# Patient Record
Sex: Male | Born: 1945 | Hispanic: Yes | Marital: Single | Smoking: Former smoker
Health system: Southern US, Community
[De-identification: ages and names within clinical notes are randomized; demographics above are authoritative.]

## PROBLEM LIST (undated history)

## (undated) DIAGNOSIS — M1990 Unspecified osteoarthritis, unspecified site: Secondary | ICD-10-CM

## (undated) DIAGNOSIS — I10 Essential (primary) hypertension: Secondary | ICD-10-CM

## (undated) DIAGNOSIS — I639 Cerebral infarction, unspecified: Secondary | ICD-10-CM

## (undated) DIAGNOSIS — E119 Type 2 diabetes mellitus without complications: Secondary | ICD-10-CM

---

## 2011-10-05 LAB — CBC
HCT: 44.2 % (ref 40.0–52.0)
HGB: 15 g/dL (ref 13.0–18.0)
MCH: 30.7 pg (ref 26.0–34.0)
MCHC: 33.9 g/dL (ref 32.0–36.0)
Platelet: 218 10*3/uL (ref 150–440)
WBC: 8.1 10*3/uL (ref 3.8–10.6)

## 2011-10-05 LAB — TROPONIN I: Troponin-I: <0.02 ng/mL

## 2011-10-05 LAB — BASIC METABOLIC PANEL
Calcium, Total: 8.9 mg/dL (ref 8.5–10.1)
Chloride: 102 mmol/L (ref 98–107)
Co2: 26 mmol/L (ref 21–32)
EGFR (Non-African Amer.): >60
Osmolality: 282 (ref 275–301)
Potassium: 4 mmol/L (ref 3.5–5.1)
Sodium: 138 mmol/L (ref 136–145)

## 2011-10-05 LAB — CK TOTAL AND CKMB (NOT AT ARMC): CK-MB: 2.9 ng/mL (ref 0.5–3.6)

## 2011-10-06 ENCOUNTER — Inpatient Hospital Stay: Payer: Self-pay | Admitting: Specialist

## 2011-10-06 LAB — CK TOTAL AND CKMB (NOT AT ARMC)
CK, Total: 93 U/L (ref 35–232)
CK, Total: 72 U/L (ref 35–232)
CK-MB: 1.7 ng/mL (ref 0.5–3.6)
CK-MB: 1.4 ng/mL (ref 0.5–3.6)

## 2011-10-06 LAB — TROPONIN I: Troponin-I: <0.02 ng/mL

## 2011-10-06 LAB — TSH: Thyroid Stimulating Horm: 0.534 u[IU]/mL

## 2011-10-06 LAB — LIPID PANEL
HDL Cholesterol: 40 mg/dL (ref 40–60)
Ldl Cholesterol, Calc: 78 mg/dL (ref 0–100)
Triglycerides: 159 mg/dL (ref 0–200)
VLDL Cholesterol, Calc: 32 mg/dL (ref 5–40)

## 2011-10-06 LAB — HEMOGLOBIN A1C: Hemoglobin A1C: 6.9 % — ABNORMAL HIGH (ref 4.2–6.3)

## 2011-10-07 LAB — BASIC METABOLIC PANEL
BUN: 24 mg/dL — ABNORMAL HIGH (ref 7–18)
Chloride: 105 mmol/L (ref 98–107)
Co2: 26 mmol/L (ref 21–32)
EGFR (African American): >60
EGFR (Non-African Amer.): >60
Glucose: 139 mg/dL — ABNORMAL HIGH (ref 65–99)
Potassium: 4.2 mmol/L (ref 3.5–5.1)
Sodium: 137 mmol/L (ref 136–145)

## 2011-10-07 LAB — MAGNESIUM: Magnesium: 2.3 mg/dL

## 2011-10-07 LAB — PROTIME-INR: INR: 0.9

## 2011-11-29 ENCOUNTER — Encounter: Payer: Self-pay | Admitting: Internal Medicine

## 2011-12-11 ENCOUNTER — Encounter: Payer: Self-pay | Admitting: Internal Medicine

## 2012-07-15 ENCOUNTER — Emergency Department: Payer: Self-pay | Admitting: Emergency Medicine

## 2012-07-15 LAB — BASIC METABOLIC PANEL
Anion Gap: 6 — ABNORMAL LOW (ref 7–16)
BUN: 24 mg/dL — ABNORMAL HIGH (ref 7–18)
Chloride: 105 mmol/L (ref 98–107)
Co2: 27 mmol/L (ref 21–32)
Creatinine: 1.09 mg/dL (ref 0.60–1.30)
EGFR (African American): >60
EGFR (Non-African Amer.): >60
Glucose: 104 mg/dL — ABNORMAL HIGH (ref 65–99)
Potassium: 4.2 mmol/L (ref 3.5–5.1)
Sodium: 138 mmol/L (ref 136–145)

## 2012-07-15 LAB — CBC
HGB: 13 g/dL (ref 13.0–18.0)
MCHC: 33.9 g/dL (ref 32.0–36.0)
MCV: 85 fL (ref 80–100)
Platelet: 247 10*3/uL (ref 150–440)

## 2012-12-14 ENCOUNTER — Emergency Department: Payer: Self-pay | Admitting: Emergency Medicine

## 2013-02-04 ENCOUNTER — Ambulatory Visit: Payer: Self-pay | Admitting: Gastroenterology

## 2013-07-08 ENCOUNTER — Encounter: Payer: Self-pay | Admitting: Internal Medicine

## 2013-07-10 ENCOUNTER — Encounter: Payer: Self-pay | Admitting: Internal Medicine

## 2013-08-09 ENCOUNTER — Encounter: Payer: Self-pay | Admitting: Internal Medicine

## 2014-01-19 ENCOUNTER — Emergency Department: Payer: Self-pay | Admitting: Emergency Medicine

## 2014-08-03 NOTE — Discharge Summary (Signed)
PATIENT NAME:  Isaiah Rogers, Isaiah Rogers MR#:  [id number] DATE OF BIRTH:  [birth year]  DATE OF ADMISSION:  10/06/2011 DATE OF DISCHARGE:  10/07/2011  For a detailed note, please take a look at the history and physical done on admission by Dr Margie EgePhichith.    DIAGNOSES AT DISCHARGE:  1. Acute ischemic stroke.  2. Left arm and left foot weakness secondary to a stroke.  3. Hypertension.   DIET: The patient is being discharged on a low-sodium, low-fat diet.   ACTIVITY: As tolerated.   FOLLOWUP: With the [facility] in the next 2 to 4 weeks.   DISCHARGE MEDICATIONS:   1. Aspirin 81 mg daily.  2. Lisinopril 5 mg daily.  3. Pravachol 10 mg daily.   Patient is being discharged home with home health physical therapy, occupational therapy, RN and aide services.   CONSULTANTS DURING THE HOSPITAL COURSE: Dr. Cristopher PeruHemang Shah from Neurology.   PERTINENT STUDIES DONE DURING THE HOSPITAL COURSE: CT scan of the head done without contrast on October 05, 2011, showing findings suggestive of a subacute region lacunar infarction within the right periventricular region, small vessel white matter ischemic changes. No acute hemorrhage. MRI of the brain done showing bilateral acute ischemic foci. Severe chronic subcortical deep white matter chronic ischemia. Ultrasound of the carotids showing no evidence of hemodynamically significant carotid stenosis with antegrade flow in both vertebrals. A 2-dimensional echocardiogram showing normal chamber size with normal LV function, mild left ventricular hypertrophy, with mild diastolic dysfunction.   HOSPITAL COURSE: This is a 69 year old male who presented to the hospital with a 3-day history of left hand and left arm and left leg weakness.   PROBLEMS: 1. Acute/subacute cerebrovascular accident: The patient presented to the hospital with some left-sided weakness. The CT scan of the head without contrast initially showed a subacute cerebrovascular accident in the left side near the  periventricular region. The patient was started on aspirin, also allowed for some permissive hypertension given his acute stroke. The patient's MRI did show bilateral foci of stroke although his symptoms are only on the left side. Neurology consult was obtained. Patient was seen by Dr. Sherryll BurgerShah.  He mentioned continuing the baby aspirin along with low-dose ACE inhibitors and a statin and outpatient aggressive PT, OT. The patient, therefore, is being discharged on the aspirin and a statin along with home health PT and occupational therapy.  2. Hypertension. The patient likely had undiagnosed hypertension.  We allowed for some permissive hypertension given his acute stroke. He is being discharged on low-dose ACE inhibitor for now.  3. Hyperlipidemia. The patient was started on low dose Pravachol and is currently being discharged on that.   CODE STATUS: THE PATIENT IS A FULL CODE.   TIME SPENT WITH DISCHARGE: 40 minutes.      ____________________________ Rolly PancakeVivek J. Cherlynn KaiserSainani, MD vjs:vtd D: 10/07/2011 15:40:55 ET T: 10/08/2011 10:03:55 ET JOB#: [id number]  cc: Rolly PancakeVivek J. Cherlynn KaiserSainani, MD, <Dictator> [facility] [place] SirenVIVEK J Kamilya Wakeman MD ELECTRONICALLY SIGNED 10/19/2011 12:24

## 2014-08-03 NOTE — H&P (Signed)
PATIENT NAME:  Isaiah Rogers, Isaiah Rogers MR#:  [id number] DATE OF BIRTH:  [birth year]  DATE OF ADMISSION:  10/06/2011  REFERRING PHYSICIAN: Dr. Marilynne Halsted    PRIMARY CARE PHYSICIAN: None   PRESENTING COMPLAINT: Left-sided weakness and numbness.   HISTORY OF PRESENT ILLNESS: Isaiah Rogers is a 69 year old Hispanic gentleman who speaks Spanish only presenting with reports of developing left-sided numbness and weakness three days ago. The patient reports symptoms progressively worsened and decided to come in today for evaluation. He denies any chest pain, shortness of breath, palpitations, presyncope or syncope. No similar episodes in the past. He reports difficulty with ambulation due to the weakness and numbness. He has poor grip. The patient is not followed by a primary physician.   PAST MEDICAL HISTORY: None.   PAST SURGICAL HISTORY: None.   ALLERGIES: None.   MEDICATIONS: He recently had a reaction from a plant exposure and was seen in [facility] and was given medication but does not know what to help with the rash and itching.   FAMILY HISTORY: No stroke, heart disease, or diabetes.   SOCIAL HISTORY: He lives in [place]. He is originally [place] He lives under rent from his employer. No tobacco, quit greater than 25 years ago. He drinks 2 to 3 shots of liquor every other week. No drug use.   REVIEW OF SYSTEMS: CONSTITUTIONAL: No fevers, nausea, or vomiting. EYES: No visual changes. ENT: No epistaxis, discharge. RESPIRATORY: No cough, hemoptysis. CARDIOVASCULAR: No chest pain, edema, palpitations, or syncope. GI: No nausea, vomiting, diarrhea, abdominal pain, hematemesis, or melena. GU: No dysuria or hematuria. ENDOCRINE: No polyuria or polydipsia. HEME: No bleeding. SKIN: No ulcers. He has resolution of his rash as per history of present illness. MUSCULOSKELETAL: No joint pain or swelling. NEUROLOGIC: As per history of present illness. PSYCH: Denies any suicidal ideation. He does feel  depressed from usual.   PHYSICAL EXAMINATION:   VITAL SIGNS: Temperature 97.4, pulse 84, respiratory rate 22, blood pressure 160/98, sating at 94% on room air.   GENERAL: Lying in bed in no apparent distress.   HEENT: Normocephalic, atraumatic. Pupils are equal and symmetric, nonicteric. Nares without discharge. Moist mucous membrane.   NECK: Soft and supple. No adenopathy. No JVP. No carotid bruits.   CARDIOVASCULAR: Non-tachy. No murmurs, rubs, or gallops.   LUNGS: Clear to auscultation bilaterally. No use of accessory muscles or increased respiratory effort.   ABDOMEN: Soft. Positive bowel sounds. No mass appreciated.   EXTREMITIES: No edema. Dorsal pedis pulses intact.   MUSCULOSKELETAL: No joint effusion.   SKIN: No ulcers.   NEUROLOGIC: No dysarthria. He has decreased strength of the upper extremities 4 out of 5 on the left side. In his bilateral lower extremities there is minimal differential in strength but reports difficulty with weightbearing and ambulation. No tongue deviation. Nose-to-finger intact. No asterixis or pronator drift.   PERTINENT LABS AND STUDIES: CT of the head without contrast shows findings likely reflecting the sequela of subacute region lacunar infarction within the right paraventricular region. Mild small vessel white matter ischemic changes. No evidence of acute hemorrhage.  WBC 8.1, hemoglobin 15, hematocrit 44.2, platelets 218, MCV 91, glucose 160, BUN 21, creatinine 1.13, sodium 138, potassium 4, chloride 102, carbon dioxide 26, calcium 8.9. Troponin less than 0.02. CK 134. MB 2.9.   EKG with sinus rate of 80. No ST elevation or depression. There is T wave inversion in aVR that is nonspecific.   ASSESSMENT AND PLAN: Isaiah Rogers is a 69 year old gentleman with  no prior diagnosis of medical history, not followed by PCP presenting with left-sided weakness and numbness for the past three days.  1. Acute/subacute CVA. CT scan results as dictated above.  Continue on tele. Cycle cardiac enzymes. Start on aspirin. Send TSH, fasting lipid panel, A1c for risk stratification. Will send for an MRI, carotid Doppler's, and echocardiogram. Obtain PT, OT, Care Management consultation. He may require rehab prior to returning home. Will need PCP upon discharge.  2. Hypertension, uncontrolled. May have longstanding untreated hypertension or secondary to CVA. Allow for permissive hypertension for now and if remains elevated consider medication prior to discharge.  3. Hyperglycemia. As above, send A1c.  4. Prophylaxis with aspirin and Lovenox.   TIME SPENT: Approximately 50 minutes spent on patient care. Interpreter was present during the entire interview.   ____________________________ Isaiah DerbyAlounthith Bret Stamour, MD ap:drc D: 10/06/2011 00:34:13 ET T: 10/06/2011 06:48:52 ET JOB#: [id number]  cc: Isaiah DerbyAlounthith Elih Mooney, MD, <Dictator> Isaiah DerbyALOUNTHITH [place] Liz MD ELECTRONICALLY SIGNED 10/29/2011 0:11

## 2014-08-21 ENCOUNTER — Other Ambulatory Visit: Payer: Self-pay | Admitting: Internal Medicine

## 2014-08-21 DIAGNOSIS — M545 Low back pain: Principal | ICD-10-CM

## 2014-08-21 DIAGNOSIS — G8929 Other chronic pain: Secondary | ICD-10-CM

## 2014-09-01 ENCOUNTER — Ambulatory Visit
Admission: RE | Admit: 2014-09-01 | Discharge: 2014-09-01 | Disposition: A | Discharge status: Home / Self Care | Payer: Medicare Other | Source: Ambulatory Visit | Attending: Internal Medicine | Admitting: Internal Medicine

## 2014-09-01 DIAGNOSIS — M6281 Muscle weakness (generalized): Secondary | ICD-10-CM | POA: Diagnosis present

## 2014-09-01 DIAGNOSIS — G544 Lumbosacral root disorders, not elsewhere classified: Secondary | ICD-10-CM | POA: Diagnosis not present

## 2014-09-01 DIAGNOSIS — M545 Low back pain, unspecified: Secondary | ICD-10-CM

## 2014-09-01 DIAGNOSIS — G8929 Other chronic pain: Secondary | ICD-10-CM

## 2014-09-01 DIAGNOSIS — M5386 Other specified dorsopathies, lumbar region: Secondary | ICD-10-CM | POA: Insufficient documentation

## 2014-09-01 MED ORDER — GADOBENATE DIMEGLUMINE 529 MG/ML IV SOLN
12.0000 mL | Freq: Once | INTRAVENOUS | Status: AC | PRN
  Administered 2014-09-01: 12 mL via INTRAVENOUS

## 2014-11-28 ENCOUNTER — Other Ambulatory Visit: Payer: Self-pay | Admitting: Neurosurgery

## 2014-12-19 ENCOUNTER — Encounter (HOSPITAL_COMMUNITY): Payer: Self-pay

## 2014-12-19 ENCOUNTER — Encounter (HOSPITAL_COMMUNITY)
Admission: RE | Admit: 2014-12-19 | Discharge: 2014-12-19 | Disposition: A | Discharge status: Home / Self Care | Payer: Medicare Other | Source: Ambulatory Visit | Attending: Neurosurgery | Admitting: Neurosurgery

## 2014-12-19 DIAGNOSIS — I10 Essential (primary) hypertension: Secondary | ICD-10-CM | POA: Diagnosis not present

## 2014-12-19 DIAGNOSIS — Z01818 Encounter for other preprocedural examination: Secondary | ICD-10-CM | POA: Insufficient documentation

## 2014-12-19 DIAGNOSIS — E119 Type 2 diabetes mellitus without complications: Secondary | ICD-10-CM | POA: Insufficient documentation

## 2014-12-19 DIAGNOSIS — Z7982 Long term (current) use of aspirin: Secondary | ICD-10-CM | POA: Insufficient documentation

## 2014-12-19 DIAGNOSIS — M4316 Spondylolisthesis, lumbar region: Secondary | ICD-10-CM | POA: Insufficient documentation

## 2014-12-19 DIAGNOSIS — Z79899 Other long term (current) drug therapy: Secondary | ICD-10-CM | POA: Diagnosis not present

## 2014-12-19 DIAGNOSIS — R001 Bradycardia, unspecified: Secondary | ICD-10-CM | POA: Insufficient documentation

## 2014-12-19 DIAGNOSIS — Z8673 Personal history of transient ischemic attack (TIA), and cerebral infarction without residual deficits: Secondary | ICD-10-CM | POA: Insufficient documentation

## 2014-12-19 DIAGNOSIS — Z0183 Encounter for blood typing: Secondary | ICD-10-CM | POA: Diagnosis not present

## 2014-12-19 DIAGNOSIS — Z01812 Encounter for preprocedural laboratory examination: Secondary | ICD-10-CM | POA: Diagnosis not present

## 2014-12-19 HISTORY — DX: Type 2 diabetes mellitus without complications: E11.9

## 2014-12-19 HISTORY — DX: Cerebral infarction, unspecified: I63.9

## 2014-12-19 HISTORY — DX: Essential (primary) hypertension: I10

## 2014-12-19 HISTORY — DX: Unspecified osteoarthritis, unspecified site: M19.90

## 2014-12-19 LAB — COMPREHENSIVE METABOLIC PANEL
ALT: 18 U/L (ref 17–63)
ANION GAP: 7 (ref 5–15)
AST: 21 U/L (ref 15–41)
Albumin: 3.9 g/dL (ref 3.5–5.0)
Alkaline Phosphatase: 82 U/L (ref 38–126)
BILIRUBIN TOTAL: 0.6 mg/dL (ref 0.3–1.2)
BUN: 17 mg/dL (ref 6–20)
CHLORIDE: 107 mmol/L (ref 101–111)
CO2: 25 mmol/L (ref 22–32)
Calcium: 9 mg/dL (ref 8.9–10.3)
Creatinine, Ser: 0.94 mg/dL (ref 0.61–1.24)
GFR calc Af Amer: >60 mL/min (ref >60)
Glucose, Bld: 111 mg/dL — ABNORMAL HIGH (ref 65–99)
Potassium: 4.1 mmol/L (ref 3.5–5.1)
Sodium: 139 mmol/L (ref 135–145)
TOTAL PROTEIN: 7.8 g/dL (ref 6.5–8.1)

## 2014-12-19 LAB — CBC
HCT: 41.7 % (ref 39.0–52.0)
Hemoglobin: 14.1 g/dL (ref 13.0–17.0)
MCH: 29.2 pg (ref 26.0–34.0)
MCHC: 33.8 g/dL (ref 30.0–36.0)
MCV: 86.3 fL (ref 78.0–100.0)
PLATELETS: 255 10*3/uL (ref 150–400)
RBC: 4.83 MIL/uL (ref 4.22–5.81)
RDW: 13.2 % (ref 11.5–15.5)
WBC: 6.9 10*3/uL (ref 4.0–10.5)

## 2014-12-19 LAB — ABO/RH: ABO/RH(D): O POS

## 2014-12-19 LAB — SURGICAL PCR SCREEN
MRSA, PCR: NEGATIVE
STAPHYLOCOCCUS AUREUS: NEGATIVE

## 2014-12-19 LAB — TYPE AND SCREEN
ABO/RH(D): O POS
ANTIBODY SCREEN: NEGATIVE

## 2014-12-19 NOTE — Progress Notes (Signed)
Call to pharm. Tech. For med. rec.

## 2014-12-19 NOTE — Pre-Procedure Instructions (Addendum)
Isaiah Rogers  12/19/2014      [facility] [place], [place] - [address] [place] [address] Phone: [phone] Fax: [phone]    Your procedure is scheduled on 12/26/2014   Report to [facility] Admitting at 7:30 A.M.   Call this number if you have problems the morning of surgery:   [phone]   Remember:  Do not eat food or drink liquids after midnight.  On Thursday                         NO ASIPRIN AFTER Saturday September 2016, the 10 th   Take these medicines the morning of surgery with A SIP OF WATER :     NONE   Do not wear jewelry   Do not wear lotions, powders, or perfumes.  You may wear deodorant.              Men may shave face and neck.   Do not bring valuables to the hospital.   [facility] is not responsible for any belongings or valuables.  Contacts, dentures or bridgework may not be worn into surgery.  Leave your suitcase in the car.  After surgery it may be brought to your room.  For patients admitted to the hospital, discharge time will be determined by your treatment team.  Patients discharged the day of surgery will not be allowed to drive home.   Name and phone number of your driver:   /w family  Special instructions:  Special Instructions: [place] - Preparing for Surgery  Before surgery, you can play an important role.  Because skin is not sterile, your skin needs to be as free of germs as possible.  You can reduce the number of germs on you skin by washing with CHG (chlorahexidine gluconate) soap before surgery.  CHG is an antiseptic cleaner which kills germs and bonds with the skin to continue killing germs even after washing.  Please DO NOT use if you have an allergy to CHG or antibacterial soaps.  If your skin becomes reddened/irritated stop using the CHG and inform your nurse when you arrive at Short Stay.  Do not shave (including legs and underarms) for at least 48 hours prior to  the first CHG shower.  You may shave your face.  Please follow these instructions carefully:   1.  Shower with CHG Soap the night before surgery and the  morning of Surgery.  2.  If you choose to wash your hair, wash your hair first as usual with your  normal shampoo.  3.  After you shampoo, rinse your hair and body thoroughly to remove the  Shampoo.  4.  Use CHG as you would any other liquid soap.  You can apply chg directly to the skin and wash gently with scrungie or a clean washcloth.  5.  Apply the CHG Soap to your body ONLY FROM THE NECK DOWN.    Do not use on open wounds or open sores.  Avoid contact with your eyes, ears, mouth and genitals (private parts).  Wash genitals (private parts)   with your normal soap.  6.  Wash thoroughly, paying special attention to the area where your surgery will be performed.  7.  Thoroughly rinse your body with warm water from the neck down.  8.  DO NOT shower/wash with your normal soap after using and rinsing off   the CHG  Soap.  9.  Pat yourself dry with a clean towel.            10.  Wear clean pajamas.            11.  Place clean sheets on your bed the night of your first shower and do not sleep with pets.  Day of Surgery  Do not apply any lotions/deodorants the morning of surgery.  Please wear clean clothes to the hospital/surgery center.  Please read over the following fact sheets that you were given. Pain Booklet, Coughing and Deep Breathing, Blood Transfusion Information, MRSA Information and Surgical Site Infection Prevention

## 2014-12-19 NOTE — Progress Notes (Signed)
Call to [facility] because in pt.'s broken spanish (per interpretor) he thinks that's where his doctor is. Left on hold for 15 mins. At [place], not able to track pt.  Call to Darl Pikes at Dr. Val Riles office, name of PCP provided & will follow up with a call to PCP at [facility],.  Via Fax, requested records from PCP.

## 2014-12-20 LAB — HEMOGLOBIN A1C
Hgb A1c MFr Bld: 6.3 % — ABNORMAL HIGH (ref 4.8–5.6)
Mean Plasma Glucose: 134 mg/dL

## 2014-12-22 NOTE — Progress Notes (Signed)
Anesthesia Chart Review:  Pt is 69 year old male scheduled for L4-5 PLIF on 12/26/2014 with Dr. Conchita Paris.   Pt is spanish-speaking only and requires interpreter.   PMH includes: stroke (2013), HTN, DM (not on meds). Unknown smoking history. BMI 24.   Medications include: ASA, iron, lisinopril, pravastatin.   Preoperative labs reviewed.  HgbA1c 6.3, glucose 111.   EKG 12/19/2014: sinus bradycardia (54 bpm).   If no changes, I anticipate pt can proceed with surgery as scheduled.   Rica Mast, FNP-BC [facility]/Anesthesiology Phone: [phone] 12/22/2014 3:46 PM

## 2014-12-26 ENCOUNTER — Inpatient Hospital Stay (HOSPITAL_COMMUNITY): Payer: Medicare Other

## 2014-12-26 ENCOUNTER — Inpatient Hospital Stay (HOSPITAL_COMMUNITY)
Admission: RE | Admit: 2014-12-26 | Discharge: 2014-12-29 | DRG: 460 | Disposition: A | Discharge status: Home Health | Payer: Medicare Other | Source: Ambulatory Visit | Attending: Neurosurgery | Admitting: Neurosurgery

## 2014-12-26 ENCOUNTER — Encounter (HOSPITAL_COMMUNITY): Payer: Self-pay | Admitting: *Deleted

## 2014-12-26 ENCOUNTER — Inpatient Hospital Stay (HOSPITAL_COMMUNITY): Payer: Medicare Other | Admitting: Certified Registered Nurse Anesthetist

## 2014-12-26 ENCOUNTER — Encounter (HOSPITAL_COMMUNITY)
Admission: RE | Disposition: A | Discharge status: Home Health | Payer: Self-pay | Source: Ambulatory Visit | Attending: Neurosurgery

## 2014-12-26 ENCOUNTER — Inpatient Hospital Stay (HOSPITAL_COMMUNITY): Payer: Medicare Other | Admitting: Emergency Medicine

## 2014-12-26 DIAGNOSIS — Z7982 Long term (current) use of aspirin: Secondary | ICD-10-CM

## 2014-12-26 DIAGNOSIS — M4316 Spondylolisthesis, lumbar region: Secondary | ICD-10-CM | POA: Diagnosis present

## 2014-12-26 DIAGNOSIS — E119 Type 2 diabetes mellitus without complications: Secondary | ICD-10-CM | POA: Diagnosis present

## 2014-12-26 DIAGNOSIS — I10 Essential (primary) hypertension: Secondary | ICD-10-CM | POA: Diagnosis present

## 2014-12-26 DIAGNOSIS — M4310 Spondylolisthesis, site unspecified: Secondary | ICD-10-CM

## 2014-12-26 DIAGNOSIS — Z79899 Other long term (current) drug therapy: Secondary | ICD-10-CM | POA: Diagnosis not present

## 2014-12-26 DIAGNOSIS — Z87891 Personal history of nicotine dependence: Secondary | ICD-10-CM | POA: Diagnosis not present

## 2014-12-26 DIAGNOSIS — M4806 Spinal stenosis, lumbar region: Secondary | ICD-10-CM | POA: Diagnosis present

## 2014-12-26 DIAGNOSIS — M4326 Fusion of spine, lumbar region: Secondary | ICD-10-CM

## 2014-12-26 DIAGNOSIS — M4726 Other spondylosis with radiculopathy, lumbar region: Secondary | ICD-10-CM | POA: Diagnosis present

## 2014-12-26 LAB — CREATININE, SERUM
CREATININE: 0.98 mg/dL (ref 0.61–1.24)
GFR calc Af Amer: >60 mL/min (ref >60)
GFR calc non Af Amer: >60 mL/min (ref >60)

## 2014-12-26 LAB — CBC
HCT: 35.5 % — ABNORMAL LOW (ref 39.0–52.0)
Hemoglobin: 11.7 g/dL — ABNORMAL LOW (ref 13.0–17.0)
MCH: 28.3 pg (ref 26.0–34.0)
MCHC: 33 g/dL (ref 30.0–36.0)
MCV: 85.7 fL (ref 78.0–100.0)
PLATELETS: 206 10*3/uL (ref 150–400)
RBC: 4.14 MIL/uL — AB (ref 4.22–5.81)
RDW: 12.9 % (ref 11.5–15.5)
WBC: 11.5 10*3/uL — ABNORMAL HIGH (ref 4.0–10.5)

## 2014-12-26 LAB — GLUCOSE, CAPILLARY
Glucose-Capillary: 107 mg/dL — ABNORMAL HIGH (ref 65–99)
Glucose-Capillary: 127 mg/dL — ABNORMAL HIGH (ref 65–99)

## 2014-12-26 SURGERY — POSTERIOR LUMBAR FUSION 1 LEVEL
Anesthesia: General | Site: Back

## 2014-12-26 MED ORDER — PRAVASTATIN SODIUM 20 MG PO TABS
20.0000 mg | ORAL_TABLET | Freq: Every day | ORAL | Status: DC
  Administered 2014-12-26 – 2014-12-28 (×3): 20 mg via ORAL
  Filled 2014-12-26 (×3): qty 1

## 2014-12-26 MED ORDER — MENTHOL 3 MG MT LOZG: 1.0000 | LOZENGE | OROMUCOSAL | Status: DC | PRN

## 2014-12-26 MED ORDER — PHENOL 1.4 % MT LIQD: 1.0000 | OROMUCOSAL | Status: DC | PRN

## 2014-12-26 MED ORDER — HYDROMORPHONE HCL 1 MG/ML IJ SOLN
INTRAMUSCULAR | Status: AC
  Filled 2014-12-26: qty 1

## 2014-12-26 MED ORDER — SALONPAS EX PADS: 1.0000 | MEDICATED_PAD | Freq: Every day | CUTANEOUS | Status: DC | PRN

## 2014-12-26 MED ORDER — VECURONIUM BROMIDE 10 MG IV SOLR
INTRAVENOUS | Status: DC | PRN
  Administered 2014-12-26: 2 mg via INTRAVENOUS
  Administered 2014-12-26: 3 mg via INTRAVENOUS

## 2014-12-26 MED ORDER — SENNA 8.6 MG PO TABS
1.0000 | ORAL_TABLET | Freq: Two times a day (BID) | ORAL | Status: DC
  Administered 2014-12-26 – 2014-12-29 (×6): 8.6 mg via ORAL
  Filled 2014-12-26 (×6): qty 1

## 2014-12-26 MED ORDER — 0.9 % SODIUM CHLORIDE (POUR BTL) OPTIME
TOPICAL | Status: DC | PRN
  Administered 2014-12-26: 1000 mL

## 2014-12-26 MED ORDER — FENTANYL CITRATE (PF) 100 MCG/2ML IJ SOLN
INTRAMUSCULAR | Status: DC | PRN
  Administered 2014-12-26: 100 ug via INTRAVENOUS
  Administered 2014-12-26 (×3): 50 ug via INTRAVENOUS

## 2014-12-26 MED ORDER — EPHEDRINE SULFATE 50 MG/ML IJ SOLN
INTRAMUSCULAR | Status: DC | PRN
  Administered 2014-12-26 (×3): 5 mg via INTRAVENOUS
  Administered 2014-12-26 (×2): 10 mg via INTRAVENOUS

## 2014-12-26 MED ORDER — MORPHINE SULFATE (PF) 2 MG/ML IV SOLN
1.0000 mg | INTRAVENOUS | Status: DC | PRN
Start: 2014-12-26 — End: 2014-12-29

## 2014-12-26 MED ORDER — LIDOCAINE HCL (CARDIAC) 20 MG/ML IV SOLN
INTRAVENOUS | Status: AC
  Filled 2014-12-26: qty 5

## 2014-12-26 MED ORDER — OXYCODONE-ACETAMINOPHEN 5-325 MG PO TABS
1.0000 | ORAL_TABLET | ORAL | Status: DC | PRN
Start: 2014-12-26 — End: 2014-12-29
  Administered 2014-12-26 – 2014-12-27 (×2): 2 via ORAL
  Administered 2014-12-27 – 2014-12-29 (×5): 1 via ORAL
  Filled 2014-12-26 (×2): qty 1
  Filled 2014-12-26: qty 2
  Filled 2014-12-26 (×2): qty 1
  Filled 2014-12-26: qty 2
  Filled 2014-12-26: qty 1

## 2014-12-26 MED ORDER — LIDOCAINE-EPINEPHRINE 1 %-1:100000 IJ SOLN
INTRAMUSCULAR | Status: DC | PRN
  Administered 2014-12-26: 10 mL

## 2014-12-26 MED ORDER — PROMETHAZINE HCL 25 MG/ML IJ SOLN: 6.2500 mg | INTRAMUSCULAR | Status: DC | PRN

## 2014-12-26 MED ORDER — ROCURONIUM BROMIDE 100 MG/10ML IV SOLN
INTRAVENOUS | Status: DC | PRN
  Administered 2014-12-26: 50 mg via INTRAVENOUS

## 2014-12-26 MED ORDER — EPHEDRINE SULFATE 50 MG/ML IJ SOLN
INTRAMUSCULAR | Status: AC
  Filled 2014-12-26: qty 1

## 2014-12-26 MED ORDER — BISACODYL 10 MG RE SUPP: 10.0000 mg | Freq: Every day | RECTAL | Status: DC | PRN

## 2014-12-26 MED ORDER — ACETAMINOPHEN 650 MG RE SUPP: 650.0000 mg | RECTAL | Status: DC | PRN

## 2014-12-26 MED ORDER — SODIUM CHLORIDE 0.9 % IJ SOLN
3.0000 mL | Freq: Two times a day (BID) | INTRAMUSCULAR | Status: DC
  Administered 2014-12-26 – 2014-12-28 (×4): 3 mL via INTRAVENOUS

## 2014-12-26 MED ORDER — LISINOPRIL 20 MG PO TABS
20.0000 mg | ORAL_TABLET | Freq: Every day | ORAL | Status: DC
  Administered 2014-12-27 – 2014-12-29 (×2): 20 mg via ORAL
  Filled 2014-12-26 (×3): qty 1

## 2014-12-26 MED ORDER — PROPOFOL 10 MG/ML IV BOLUS
INTRAVENOUS | Status: AC
  Filled 2014-12-26: qty 20

## 2014-12-26 MED ORDER — FENTANYL CITRATE (PF) 250 MCG/5ML IJ SOLN
INTRAMUSCULAR | Status: AC
  Filled 2014-12-26: qty 5

## 2014-12-26 MED ORDER — SODIUM CHLORIDE 0.9 % IR SOLN
Status: DC | PRN
  Administered 2014-12-26: 500 mL

## 2014-12-26 MED ORDER — HEPARIN SODIUM (PORCINE) 5000 UNIT/ML IJ SOLN
5000.0000 [IU] | Freq: Three times a day (TID) | INTRAMUSCULAR | Status: DC
Start: 2014-12-27 — End: 2014-12-29
  Administered 2014-12-27 – 2014-12-29 (×6): 5000 [IU] via SUBCUTANEOUS
  Filled 2014-12-26 (×6): qty 1

## 2014-12-26 MED ORDER — FERROUS SULFATE 325 (65 FE) MG PO TABS
325.0000 mg | ORAL_TABLET | Freq: Every day | ORAL | Status: DC
  Administered 2014-12-27 – 2014-12-29 (×3): 325 mg via ORAL
  Filled 2014-12-26 (×3): qty 1

## 2014-12-26 MED ORDER — SODIUM CHLORIDE 0.9 % IV SOLN
INTRAVENOUS | Status: DC
  Administered 2014-12-26: 75 mL/h via INTRAVENOUS
  Administered 2014-12-27 (×2): via INTRAVENOUS

## 2014-12-26 MED ORDER — MIDAZOLAM HCL 5 MG/5ML IJ SOLN
INTRAMUSCULAR | Status: DC | PRN
  Administered 2014-12-26: 2 mg via INTRAVENOUS

## 2014-12-26 MED ORDER — NEOSTIGMINE METHYLSULFATE 10 MG/10ML IV SOLN
INTRAVENOUS | Status: DC | PRN
Start: 2014-12-26 — End: 2014-12-26
  Administered 2014-12-26: 3 mg via INTRAVENOUS

## 2014-12-26 MED ORDER — SODIUM CHLORIDE 0.9 % IV SOLN: 250.0000 mL | INTRAVENOUS | Status: DC

## 2014-12-26 MED ORDER — HYDROMORPHONE HCL 1 MG/ML IJ SOLN
0.2500 mg | INTRAMUSCULAR | Status: DC | PRN
  Administered 2014-12-26 (×2): 0.5 mg via INTRAVENOUS

## 2014-12-26 MED ORDER — ARTIFICIAL TEARS OP OINT
TOPICAL_OINTMENT | OPHTHALMIC | Status: DC | PRN
  Administered 2014-12-26: 1 via OPHTHALMIC

## 2014-12-26 MED ORDER — LIDOCAINE HCL (CARDIAC) 20 MG/ML IV SOLN
INTRAVENOUS | Status: DC | PRN
  Administered 2014-12-26: 100 mg via INTRAVENOUS

## 2014-12-26 MED ORDER — ACETAMINOPHEN 325 MG PO TABS
650.0000 mg | ORAL_TABLET | ORAL | Status: DC | PRN
  Administered 2014-12-27 – 2014-12-29 (×4): 650 mg via ORAL
  Filled 2014-12-26 (×4): qty 2

## 2014-12-26 MED ORDER — VECURONIUM BROMIDE 10 MG IV SOLR
INTRAVENOUS | Status: AC
  Filled 2014-12-26: qty 10

## 2014-12-26 MED ORDER — ONDANSETRON HCL 4 MG/2ML IJ SOLN
INTRAMUSCULAR | Status: AC
  Filled 2014-12-26: qty 2

## 2014-12-26 MED ORDER — SODIUM CHLORIDE 0.9 % IJ SOLN: 3.0000 mL | INTRAMUSCULAR | Status: DC | PRN

## 2014-12-26 MED ORDER — ONDANSETRON HCL 4 MG/2ML IJ SOLN
INTRAMUSCULAR | Status: DC | PRN
  Administered 2014-12-26: 4 mg via INTRAVENOUS

## 2014-12-26 MED ORDER — DIAZEPAM 5 MG PO TABS: 5.0000 mg | ORAL_TABLET | Freq: Four times a day (QID) | ORAL | Status: DC | PRN

## 2014-12-26 MED ORDER — GLYCOPYRROLATE 0.2 MG/ML IJ SOLN
INTRAMUSCULAR | Status: DC | PRN
  Administered 2014-12-26: .5 mg via INTRAVENOUS

## 2014-12-26 MED ORDER — CEFAZOLIN SODIUM-DEXTROSE 2-3 GM-% IV SOLR
2.0000 g | Freq: Three times a day (TID) | INTRAVENOUS | Status: AC
  Administered 2014-12-27 (×2): 2 g via INTRAVENOUS
  Filled 2014-12-26 (×2): qty 50

## 2014-12-26 MED ORDER — STERILE WATER FOR INJECTION IJ SOLN
INTRAMUSCULAR | Status: AC
  Filled 2014-12-26: qty 10

## 2014-12-26 MED ORDER — ARTIFICIAL TEARS OP OINT
TOPICAL_OINTMENT | OPHTHALMIC | Status: AC
  Filled 2014-12-26: qty 3.5

## 2014-12-26 MED ORDER — SUCCINYLCHOLINE CHLORIDE 20 MG/ML IJ SOLN
INTRAMUSCULAR | Status: AC
  Filled 2014-12-26: qty 1

## 2014-12-26 MED ORDER — DOCUSATE SODIUM 100 MG PO CAPS
100.0000 mg | ORAL_CAPSULE | Freq: Two times a day (BID) | ORAL | Status: DC
  Administered 2014-12-26 – 2014-12-29 (×6): 100 mg via ORAL
  Filled 2014-12-26 (×6): qty 1

## 2014-12-26 MED ORDER — PROPOFOL 10 MG/ML IV BOLUS
INTRAVENOUS | Status: DC | PRN
  Administered 2014-12-26: 150 mg via INTRAVENOUS

## 2014-12-26 MED ORDER — THROMBIN 20000 UNITS EX SOLR
CUTANEOUS | Status: DC | PRN
  Administered 2014-12-26: 20 mL via TOPICAL

## 2014-12-26 MED ORDER — LACTATED RINGERS IV SOLN
INTRAVENOUS | Status: DC
  Administered 2014-12-26 (×2): via INTRAVENOUS

## 2014-12-26 MED ORDER — THROMBIN 5000 UNITS EX SOLR
OROMUCOSAL | Status: DC | PRN
  Administered 2014-12-26: 10 mL via TOPICAL

## 2014-12-26 MED ORDER — MIDAZOLAM HCL 2 MG/2ML IJ SOLN
INTRAMUSCULAR | Status: AC
  Filled 2014-12-26: qty 4

## 2014-12-26 MED ORDER — ROCURONIUM BROMIDE 50 MG/5ML IV SOLN
INTRAVENOUS | Status: AC
  Filled 2014-12-26: qty 1

## 2014-12-26 MED ORDER — CYANOCOBALAMIN 250 MCG PO TABS
250.0000 ug | ORAL_TABLET | Freq: Every day | ORAL | Status: DC
  Administered 2014-12-27 – 2014-12-29 (×3): 250 ug via ORAL
  Filled 2014-12-26 (×7): qty 1

## 2014-12-26 MED ORDER — GABAPENTIN 300 MG PO CAPS
300.0000 mg | ORAL_CAPSULE | Freq: Three times a day (TID) | ORAL | Status: DC
  Administered 2014-12-26 – 2014-12-29 (×8): 300 mg via ORAL
  Filled 2014-12-26 (×8): qty 1

## 2014-12-26 MED ORDER — ONDANSETRON HCL 4 MG/2ML IJ SOLN: 4.0000 mg | INTRAMUSCULAR | Status: DC | PRN

## 2014-12-26 MED ORDER — PHENYLEPHRINE 40 MCG/ML (10ML) SYRINGE FOR IV PUSH (FOR BLOOD PRESSURE SUPPORT)
PREFILLED_SYRINGE | INTRAVENOUS | Status: AC
  Filled 2014-12-26: qty 10

## 2014-12-26 MED ORDER — ROPINIROLE HCL 1 MG PO TABS
0.5000 mg | ORAL_TABLET | Freq: Every day | ORAL | Status: DC
  Administered 2014-12-26 – 2014-12-28 (×3): 0.5 mg via ORAL
  Filled 2014-12-26 (×3): qty 1

## 2014-12-26 MED ORDER — PANTOPRAZOLE SODIUM 40 MG IV SOLR
40.0000 mg | Freq: Every day | INTRAVENOUS | Status: DC
  Administered 2014-12-26: 40 mg via INTRAVENOUS
  Filled 2014-12-26: qty 40

## 2014-12-26 MED ORDER — CEFAZOLIN SODIUM-DEXTROSE 2-3 GM-% IV SOLR
2.0000 g | INTRAVENOUS | Status: AC
  Administered 2014-12-26: 2 g via INTRAVENOUS
  Filled 2014-12-26: qty 50

## 2014-12-26 SURGICAL SUPPLY — 73 items
BAG DECANTER FOR FLEXI CONT (MISCELLANEOUS) ×3 IMPLANT
BENZOIN TINCTURE PRP APPL 2/3 (GAUZE/BANDAGES/DRESSINGS) IMPLANT
BIT DRILL 3.5 POWEREASE (BIT) ×2 IMPLANT
BIT DRILL 3.5MM POWEREASE (BIT) ×1
BLADE CLIPPER SURG (BLADE) IMPLANT
BLADE SURG 11 STRL SS (BLADE) ×3 IMPLANT
BUR MATCHSTICK NEURO 3.0 LAGG (BURR) ×3 IMPLANT
BUR PRECISION FLUTE 5.0 (BURR) ×3 IMPLANT
CANISTER SUCT 3000ML PPV (MISCELLANEOUS) ×3 IMPLANT
CATH FOLEY 2WAY SLVR  5CC 14FR (CATHETERS) ×2
CATH FOLEY 2WAY SLVR 5CC 14FR (CATHETERS) ×1 IMPLANT
CLOSURE WOUND 1/2 X4 (GAUZE/BANDAGES/DRESSINGS)
CONT SPEC 4OZ CLIKSEAL STRL BL (MISCELLANEOUS) ×3 IMPLANT
COVER BACK TABLE 60X90IN (DRAPES) ×3 IMPLANT
DECANTER SPIKE VIAL GLASS SM (MISCELLANEOUS) ×3 IMPLANT
DERMABOND ADHESIVE PROPEN (GAUZE/BANDAGES/DRESSINGS) ×2
DERMABOND ADVANCED .7 DNX6 (GAUZE/BANDAGES/DRESSINGS) ×1 IMPLANT
DRAPE C-ARM 42X72 X-RAY (DRAPES) ×6 IMPLANT
DRAPE C-ARMOR (DRAPES) ×3 IMPLANT
DRAPE LAPAROTOMY 100X72X124 (DRAPES) ×3 IMPLANT
DRAPE MICROSCOPE LEICA (MISCELLANEOUS) ×3 IMPLANT
DRAPE POUCH INSTRU U-SHP 10X18 (DRAPES) ×3 IMPLANT
DRAPE SURG 17X23 STRL (DRAPES) ×3 IMPLANT
DRSG OPSITE POSTOP 4X6 (GAUZE/BANDAGES/DRESSINGS) ×3 IMPLANT
DURAPREP 26ML APPLICATOR (WOUND CARE) ×3 IMPLANT
ELECT REM PT RETURN 9FT ADLT (ELECTROSURGICAL) ×3
ELECTRODE REM PT RTRN 9FT ADLT (ELECTROSURGICAL) ×1 IMPLANT
GAUZE SPONGE 4X4 12PLY STRL (GAUZE/BANDAGES/DRESSINGS) IMPLANT
GAUZE SPONGE 4X4 16PLY XRAY LF (GAUZE/BANDAGES/DRESSINGS) IMPLANT
GLOVE BIOGEL PI IND STRL 7.5 (GLOVE) ×1 IMPLANT
GLOVE BIOGEL PI INDICATOR 7.5 (GLOVE) ×2
GLOVE ECLIPSE 7.0 STRL STRAW (GLOVE) ×3 IMPLANT
GLOVE EXAM NITRILE LRG STRL (GLOVE) IMPLANT
GLOVE EXAM NITRILE MD LF STRL (GLOVE) IMPLANT
GLOVE EXAM NITRILE XL STR (GLOVE) IMPLANT
GLOVE EXAM NITRILE XS STR PU (GLOVE) IMPLANT
GOWN STRL REUS W/ TWL LRG LVL3 (GOWN DISPOSABLE) ×2 IMPLANT
GOWN STRL REUS W/ TWL XL LVL3 (GOWN DISPOSABLE) IMPLANT
GOWN STRL REUS W/TWL 2XL LVL3 (GOWN DISPOSABLE) IMPLANT
GOWN STRL REUS W/TWL LRG LVL3 (GOWN DISPOSABLE) ×4
GOWN STRL REUS W/TWL XL LVL3 (GOWN DISPOSABLE)
HEMOSTAT POWDER KIT SURGIFOAM (HEMOSTASIS) ×3 IMPLANT
KIT BASIN OR (CUSTOM PROCEDURE TRAY) ×3 IMPLANT
KIT INFUSE SMALL (Orthopedic Implant) ×3 IMPLANT
KIT ROOM TURNOVER OR (KITS) ×3 IMPLANT
MILL MEDIUM DISP (BLADE) ×3 IMPLANT
NEEDLE HYPO 18GX1.5 BLUNT FILL (NEEDLE) IMPLANT
NEEDLE HYPO 25X1 1.5 SAFETY (NEEDLE) ×3 IMPLANT
NEEDLE SPNL 18GX3.5 QUINCKE PK (NEEDLE) IMPLANT
NS IRRIG 1000ML POUR BTL (IV SOLUTION) ×3 IMPLANT
PACK LAMINECTOMY NEURO (CUSTOM PROCEDURE TRAY) ×3 IMPLANT
PAD ARMBOARD 7.5X6 YLW CONV (MISCELLANEOUS) ×9 IMPLANT
POWEREASE STERILE 3.5MM DRILL BIT ×2 IMPLANT
ROD CC 30MM (Rod) ×6 IMPLANT
RUBBERBAND STERILE (MISCELLANEOUS) ×6 IMPLANT
SCREW 5.5X30MM (Screw) ×6 IMPLANT
SCREW 5.5X35MM (Screw) ×4 IMPLANT
SCREW BN 35X5.5XMA NS SPNE (Screw) ×2 IMPLANT
SCREW SET SOLERA (Screw) ×8 IMPLANT
SCREW SET SOLERA TI (Screw) ×4 IMPLANT
SPONGE LAP 4X18 X RAY DECT (DISPOSABLE) IMPLANT
SPONGE SURGIFOAM ABS GEL 100 (HEMOSTASIS) ×3 IMPLANT
STRIP BIOACTIVE VITOSS 25X52X4 (Orthopedic Implant) ×3 IMPLANT
STRIP CLOSURE SKIN 1/2X4 (GAUZE/BANDAGES/DRESSINGS) IMPLANT
SUT VIC AB 0 CT1 18XCR BRD8 (SUTURE) ×1 IMPLANT
SUT VIC AB 0 CT1 8-18 (SUTURE) ×2
SUT VIC AB 2-0 CT1 18 (SUTURE) IMPLANT
SUT VICRYL 3-0 RB1 18 ABS (SUTURE) ×3 IMPLANT
SYR 3ML LL SCALE MARK (SYRINGE) IMPLANT
TOWEL OR 17X24 6PK STRL BLUE (TOWEL DISPOSABLE) ×3 IMPLANT
TOWEL OR 17X26 10 PK STRL BLUE (TOWEL DISPOSABLE) ×3 IMPLANT
TRAP SPECIMEN MUCOUS 40CC (MISCELLANEOUS) ×3 IMPLANT
WATER STERILE IRR 1000ML POUR (IV SOLUTION) ×3 IMPLANT

## 2014-12-26 NOTE — H&P (Signed)
CC:  No chief complaint on file.   HPI: Isaiah Rogers is a 69 year old man seen for initial consultation in the office. He is a native [place] speaker. His primary complaint is fairly chronic back pain, although over the last few months it has become constant. He says previously, he had had intermittent flareups of back and leg pain, but now the pain is fairly constant. He says it is worsened when he first gets up after being sedentary for a period of time. It is worst in the morning when he gets up. He says after he has moving around for a while the pain actually improves a slight bit. He also has pain which runs down the back of his leg, on the left worse than the right. He is also describing numbness especially of his feet, left worse than the right. He has undergone a course of physical therapy for this, but says that it did not improve his pain, and actually may have made his back will bit worse.   PMH: Past Medical History  Diagnosis Date  . Hypertension   . Stroke   . Arthritis     lumbar spondylosis  . Diabetes mellitus without complication     told that he has diabetes but not treated     PSH: History reviewed. No pertinent past surgical history.  SH: Social History  Substance Use Topics  . Smoking status: Former Smoker    Types: Cigarettes  . Smokeless tobacco: Former Neurosurgeon    Quit date: 12/19/1990     Comment: quit more than 25 years ago  . Alcohol Use: No    MEDS: Prior to Admission medications   Medication Sig Start Date End Date Taking? Authorizing Provider  aspirin EC 325 MG tablet Take 325 mg by mouth daily with lunch.   Yes Historical Provider, MD  Cyanocobalamin (VITAMIN B12 PO) Take 1 tablet by mouth at bedtime.   Yes Historical Provider, MD  gabapentin (NEURONTIN) 300 MG capsule Take 300 mg by mouth 3 (three) times daily.    Yes Historical Provider, MD  Liniments Chinita Pester) PADS Apply 1 each topically daily as needed (back pain).   Yes Historical Provider,  MD  lisinopril (PRINIVIL,ZESTRIL) 20 MG tablet Take 20 mg by mouth daily.   Yes Historical Provider, MD  rOPINIRole (REQUIP) 0.5 MG tablet Take 0.5 mg by mouth at bedtime.   Yes Historical Provider, MD  ferrous sulfate 325 (65 FE) MG tablet Take 325 mg by mouth daily with breakfast.    Historical Provider, MD  pravastatin (PRAVACHOL) 20 MG tablet Take 20 mg by mouth at bedtime.     Historical Provider, MD    ALLERGY: No Known Allergies  ROS: ROS  NEUROLOGIC EXAM: Awake, alert, oriented Memory and concentration grossly intact Speech fluent, appropriate CN grossly intact Motor exam: Upper Extremities Deltoid Bicep Tricep Grip  Right 5/5 5/5 5/5 5/5  Left 5/5 5/5 5/5 5/5   Lower Extremity IP Quad PF DF EHL  Right 5/5 5/5 5/5 5/5 5/5  Left 5/5 5/5 5/5 5/5 5/5   Sensation grossly intact to LT  IMGAING: MRI of the lumbar spine was reviewed, which demonstrates primary pathology at L4 L5 where there is bilateral L4 pars defects, and grade 2 spondylolisthesis. Thecal sac is patent, however there is severe bilateral foraminal stenosis due to the spondylolisthesis.  IMPRESSION: 69 year old man with back and leg pain related to grade 2 spondylolisthesis at L4 L5.  PLAN: - Proceed with L4-5 fusion  I  reviewed the MRI findings with the patient and his family in the office. Treatment options were discussed, including continued conservative treatment, versus surgical decompression and fusion. I explained to the patient the details of the procedure, as well as the risks which include but are not limited to nerve root injury leading to leg or foot weakness and or bowel and bladder dysfunction, CSF leak, bleeding, and infection. Possible outcomes of surgery were also discussed including the possibility of uncomplicated surgery but persistence of pain symptoms and the possiblity of accelerated adjacent level degeneration. All questions were answered.

## 2014-12-26 NOTE — Op Note (Signed)
PREOP DIAGNOSIS:  1. 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 Spondylolisthesis, L4-5  POSTOP DIAGNOSIS: Same  PROCEDURE: 1. L4-5 Gill procedure (L4 lamiectomy, radical bilateral L4-5 facetectomy) for decompression of L4 nreve roots 2. Bilateral L5 foraminotomies 3. Posterior non-segmental instrumentation using cortical pedicle screws at L4, L5, Medtronic Solera 5.5 x 30, 5.5 x 35 4. Posterolateral arthrodesis, L4-5 5. Use of locally harvested morcellized bone autograft 6. Use of non-structural bone allograft (Vitoss) 6. Use of BMP  SURGEON: Dr. Lisbeth Renshaw, MD  ASSISTANT: Dr. Barnett Abu, MD  ANESTHESIA: General Endotracheal  EBL: 200cc  SPECIMENS: None  DRAINS: None  COMPLICATIONS: none immediate  CONDITION: Hemodynamically stable to PACU  HISTORY: Isaiah Rogers is a 69 y.o. male initially seen in the outpatient clinic with back and leg pain c/w mechanical type pain with a radiculopathy. MRI demonstrated grade 2 spondylolisthesis with L4 pars defect and severe bilateral foraminal stenosis. He failed an attempt at conservative treatment and elected to proceed with surgical decompression and fusion.  PROCEDURE IN DETAIL: After informed consent was obtained and witnessed, the patient was brought to the operating room. After induction of general anesthesia, the patient was positioned on the operative table in the prone position. All pressure points were meticulously padded. Previous skin incision was then marked out and prepped and draped in the usual sterile fashion.  After timeout was conducted, skin was infiltrated with local anesthetic. Skin incision was then made sharply and Bovie electrocautery was used to dissect the subcutaneous tissue until the lumbodorsal fascia was identified and incised. The muscle was then elevated in the subperiosteal plane. Self-retaining retractors were then placed after exposure of the L4 lamina out to the pars  interarticularis as was the L4-5 facet complex. Correct level was confirmed with fluoroscopy. The bilateral pars defects were easily identified.  Using intraoperative fluoroscopy, entry points for bilateral L4 cortical pedicle screws were identified. Pilot holes were then created under lateral fluoroscopy and tapped to 30mm x 5.5 mm.  At this point attention was turned to decompression. Complete L4 laminectomy with facetectomy was completed using a combination of Kerrison rongeurs and a high-speed drill. The pars defect was identified bilaterally. The L4-5 foramina were noted to be significantly stenotic due to the anterolisthesis. Good decompression was confirmed after facetectomy with a blunt probe. Proximal L5-S1 foraminotomy was also completed with kerrison rongeurs.  At this point, the space between the L4 and L5 nerve roots on both sides was very limited, and it did not seem feasible to retract the thecal sac and traversing L5 root without placing excessive traction on the L5 roots. I therefore elected not to attempt discectomy and interbody fusion.  Therefore proceeded with identification of bilateral L5 cortical pedicle screw entry points under lateral fluoroscopy. Pilot holes were then created with the high-speed drill, and tapped to 35 mm x 5.5 mm.  Attention was then turned to dissection in the posterolateral gutters, exposing the L4 and L5 lateral facet and transverse processes bilaterally. The high-speed drill was then used to decorticate these posterior lateral bone surfaces.  The posterior lateral gutter was then packed with a small pack of BMP, and graft was placed utilizing a combination of morcellized bone autograft harvested during the decompression portion of the procedure, with Vitoss.  Within completed placement of the cortical pedicle screws with 5.5 x 30 mm screws at L4, and 5.5 x 35 mm screws at L5. A short pre-bent lordotic rod was then placed, and setscrews placed and final  tightened. Final AP and lateral fluoroscopic images were taken which confirmed good position of the hardware. Good hemostasis was then secured using a combination of bipolar electrocautery and morcellized Gelfoam with thrombin. The wound is then irrigated with copious amounts of antibiotic irrigation.  The wound is then closed in standard fashion using a combination of interrupted 0 and 3-0 Vicryl stitches  cottonoid,he muscular, fascial, and subcutaneous layers. Skin was then closed using standard Dermabond. Sterile dressing was then applied. The patient was then transferred to the stretcher, extubated, and taken to the postanesthesia care unit in stable hemodynamic condition.  At the end of the case all sponge, needle, and instrument counts were correct.

## 2014-12-26 NOTE — Anesthesia Preprocedure Evaluation (Addendum)
Anesthesia Evaluation  Patient identified by MRN, date of birth, ID band Patient awake    Reviewed: Allergy & Precautions, NPO status , Patient's Chart, lab work & pertinent test results  Airway Mallampati: II  TM Distance: >3 FB Neck ROM: Full    Dental   Pulmonary neg pulmonary ROS, former smoker,    breath sounds clear to auscultation       Cardiovascular hypertension,  Rhythm:Regular Rate:Normal     Neuro/Psych    GI/Hepatic negative GI ROS, Neg liver ROS,   Endo/Other  diabetes  Renal/GU      Musculoskeletal   Abdominal   Peds  Hematology   Anesthesia Other Findings   Reproductive/Obstetrics                            Anesthesia Physical Anesthesia Plan  ASA: III  Anesthesia Plan: General   Post-op Pain Management:    Induction: Intravenous  Airway Management Planned: Oral ETT  Additional Equipment:   Intra-op Plan:   Post-operative Plan: Extubation in OR  Informed Consent: I have reviewed the patients History and Physical, chart, labs and discussed the procedure including the risks, benefits and alternatives for the proposed anesthesia with the patient or authorized representative who has indicated his/her understanding and acceptance.   Dental advisory given  Plan Discussed with: CRNA and Anesthesiologist  Anesthesia Plan Comments:        Anesthesia Quick Evaluation

## 2014-12-26 NOTE — Anesthesia Procedure Notes (Signed)
Procedure Name: Intubation Date/Time: 12/26/2014 10:18 AM Performed by: Margaree Mackintosh Pre-anesthesia Checklist: Patient identified, Emergency Drugs available, Suction available, Patient being monitored and Timeout performed Patient Re-evaluated:Patient Re-evaluated prior to inductionOxygen Delivery Method: Circle system utilized Preoxygenation: Pre-oxygenation with 100% oxygen Intubation Type: IV induction Ventilation: Mask ventilation without difficulty Laryngoscope Size: Mac and 3 Grade View: Grade I Tube type: Oral Tube size: 7.5 mm Number of attempts: 1 Airway Equipment and Method: Stylet Placement Confirmation: ETT inserted through vocal cords under direct vision,  positive ETCO2 and breath sounds checked- equal and bilateral Secured at: 22 cm Tube secured with: Tape Dental Injury: Teeth and Oropharynx as per pre-operative assessment  Comments: Very loose bottom teeth. Moveable with scissoring of the mouth. Remain as per pre-op assessment.

## 2014-12-26 NOTE — Anesthesia Postprocedure Evaluation (Signed)
  Anesthesia Post-op Note  Patient: Isaiah Rogers  Procedure(s) Performed: Procedure(s) with comments: POSTERIOR LUMBAR FUSION 1 LEVEL (N/A) - L45 posterior lumbar interbody fusion with interbody prosthesis posterior lateral arthrodesis and posterior nonsegmental instrumentation  Patient Location: PACU  Anesthesia Type:General  Level of Consciousness: awake  Airway and Oxygen Therapy: Patient Spontanous Breathing  Post-op Pain: mild  Post-op Assessment: Post-op Vital signs reviewed              Post-op Vital Signs: Reviewed  Last Vitals:  Filed Vitals:   12/26/14 1449  BP:   Pulse:   Temp: 37.2 C  Resp:     Complications: No apparent anesthesia complications

## 2014-12-26 NOTE — Progress Notes (Signed)
Utilization review completed.  

## 2014-12-26 NOTE — Progress Notes (Signed)
Used interpreter line, spoke with [place], [phone], until Karrie Meres arrived at this time. Neuro OR called, spoke with Ivar Drape and informed.

## 2014-12-26 NOTE — Transfer of Care (Signed)
Immediate Anesthesia Transfer of Care Note  Patient: Isaiah Rogers  Procedure(s) Performed: Procedure(s) with comments: POSTERIOR LUMBAR FUSION 1 LEVEL (N/A) - L45 posterior lumbar interbody fusion with interbody prosthesis posterior lateral arthrodesis and posterior nonsegmental instrumentation  Patient Location: PACU  Anesthesia Type:General  Level of Consciousness: awake and alert   Airway & Oxygen Therapy: Patient Spontanous Breathing and Patient connected to nasal cannula oxygen  Post-op Assessment: Report given to RN, Post -op Vital signs reviewed and stable and Patient moving all extremities X 4  Post vital signs: Reviewed and stable  Last Vitals:  Filed Vitals:   12/26/14 0716  BP: 142/86  Pulse: 58  Temp: 36.8 C  Resp: 18    Complications: No apparent anesthesia complications

## 2014-12-27 MED ORDER — PANTOPRAZOLE SODIUM 40 MG PO TBEC
40.0000 mg | DELAYED_RELEASE_TABLET | Freq: Every day | ORAL | Status: DC
  Administered 2014-12-27 – 2014-12-28 (×2): 40 mg via ORAL
  Filled 2014-12-27 (×2): qty 1

## 2014-12-27 NOTE — Progress Notes (Signed)
Foley Catheter removed at 1216, patient due to void by 1800. Patient stated via interpreter that he voided while working with PT. Bladder scan used showing 50mL residual. Patient states he does not need to void. Will continue to monitor.

## 2014-12-27 NOTE — Plan of Care (Signed)
Mr Kegley does not have a back brace with him and did not get fitted for one prior to surgery.  Will postpone ambulation until a brace can be obtained.

## 2014-12-27 NOTE — Evaluation (Signed)
Occupational Therapy Evaluation Patient Details Name: Isaiah Rogers MRN: [id number] DOB: 10-May-1945 Today's Date: 12/27/2014    History of Present Illness Pt is a 69 y/o male s/p L4 laminectomy, radical bilateral L4-L5 facetectomy, bilateral L5 foraminotomies, and L4-L5 fusion.  PMH of HTN, stroke, arthritis, and DM.   Clinical Impression   Pt admitted with above. He demonstrates the below listed deficits and will benefit from continued OT to maximize safety and independence with BADLs.  Pt is able to perform ADLs with supervision.  He will benefit from another OT session to ensure carry over of precautions and safety.       Follow Up Recommendations  Home health OT;Supervision/Assistance - 24 hour    Equipment Recommendations  None recommended by OT    Recommendations for Other Services       Precautions / Restrictions Precautions Precautions: Back;Fall Precaution Booklet Issued: No Precaution Comments: precautions verbalized to pt via telephone translator but unsure of pt understanding Required Braces or Orthoses: Spinal Brace Restrictions Weight Bearing Restrictions: No      Mobility Bed Mobility Overal bed mobility: Needs Assistance Bed Mobility: Rolling;Sidelying to Sit Rolling: Min assist (visual cues and assist to bend knees and log roll; bed rails) Sidelying to sit: Modified independent (Device/Increase time) (bed rails)          Transfers Overall transfer level: Needs assistance Equipment used: None Transfers: Sit to/from [employer] Sit to Stand: Supervision Stand pivot transfers: Supervision            Balance Overall balance assessment: Needs assistance Sitting-balance support: Feet supported Sitting balance-Leahy Scale: Good     Standing balance support: During functional activity Standing balance-Leahy Scale: Fair                              ADL Overall ADL's : Needs assistance/impaired Eating/Feeding:  Independent;Sitting   Grooming: Wash/dry hands;Wash/dry face;Oral care;Brushing hair;Supervision/safety;Standing Grooming Details (indicate cue type and reason): Pt instructed to bring cup to mouth to spit in while brushing teeth, and to avoid bending while shaving  Upper Body Bathing: Set up;Supervision/ safety;Sitting   Lower Body Bathing: Supervison/ safety;Sit to/from stand Lower Body Bathing Details (indicate cue type and reason): Pt is able to cross ankles over knees to simulate bathing feet  Upper Body Dressing : Set up;Supervision/safety;Sitting   Lower Body Dressing: Supervision/safety;Set up;Sit to/from stand Lower Body Dressing Details (indicate cue type and reason): Pt was instructed to cross ankles over knees for LB ADLs and was able to return demonstration  Toilet Transfer: Supervision/safety;Ambulation;Regular Toilet;BSC;RW   Toileting- Clothing Manipulation and Hygiene: Supervision/safety;Sit to/from stand       Functional mobility during ADLs: Supervision/safety;Rolling walker General ADL Comments: Reviewed back precautions, how to don/doff brace, safety with ADLs and IADLs reviewed with pt      Vision     Perception     Praxis      Pertinent Vitals/Pain Pain Assessment: 0-10 Pain Score: 5  Faces Pain Scale: Hurts even more Pain Location: back  Pain Descriptors / Indicators: Aching;Constant;Grimacing Pain Intervention(s): Monitored during session;Repositioned;Patient requesting pain meds-RN notified     Hand Dominance     Extremity/Trunk Assessment Upper Extremity Assessment Upper Extremity Assessment: Overall WFL for tasks assessed   Lower Extremity Assessment Lower Extremity Assessment: Defer to PT evaluation       Communication Communication Communication: Prefers language other than English (Spanish)   Cognition Arousal/Alertness: Awake/alert Behavior During Therapy: WFL for  tasks assessed/performed Overall Cognitive Status: Difficult to  assess                     General Comments       Exercises       Shoulder Instructions      Home Living Family/patient expects to be discharged to:: Private residence Living Arrangements: Alone Available Help at Discharge: Friend(s);Available PRN/intermittently;Available 24 hours/day Type of Home: House Home Access: Stairs to enter [employer] of Steps: 2   Home Layout: One level     Bathroom Shower/Tub: Tub/shower unit;Curtain Shower/tub characteristics: Engineer, building services: Standard     Home Equipment: None   Additional Comments: [employer] interpreter line utilized for eval.  Pt reports he lives alone, and that his friends that brought him to the hospital have offered to allow him to stay with them at discharge      Prior Functioning/Environment Level of Independence: Independent        Comments: prior to admission was living alone; plan is to discharge with family    OT Diagnosis: Generalized weakness;Acute pain   OT Problem List: Decreased strength;Impaired balance (sitting and/or standing);Decreased safety awareness;Decreased knowledge of use of DME or AE;Decreased knowledge of precautions;Pain   OT Treatment/Interventions: Self-care/ADL training;DME and/or AE instruction;Therapeutic activities;Patient/family education    OT Goals(Current goals can be found in the care plan section) Acute Rehab OT Goals Patient Stated Goal: none stated  OT Goal Formulation: With patient Time For Goal Achievement: 01/03/15 Potential to Achieve Goals: Good ADL Goals Pt Will Perform Grooming: with modified independence;standing Pt Will Perform Lower Body Bathing: with modified independence;sit to/from stand Pt Will Perform Lower Body Dressing: with modified independence;sit to/from stand Pt Will Transfer to Toilet: with modified independence;ambulating;regular height toilet;grab bars Pt Will Perform Toileting - Clothing Manipulation and hygiene:  with modified independence;sit to/from stand Pt Will Perform Tub/Shower Transfer: Tub transfer;with supervision;ambulating;rolling walker  OT Frequency: Min 2X/week   Barriers to D/C:            Co-evaluation              End of Session Equipment Utilized During Treatment: Back brace Nurse Communication: Patient requests pain meds;Mobility status  Activity Tolerance: Patient tolerated treatment well Patient left: in chair;with call bell/phone within reach;with nursing/sitter in room   Time: 1521-1558 OT Time Calculation (min): 37 min Charges:  OT General Charges $OT Visit: 1 Procedure OT Treatments $Self Care/Home Management : 8-22 mins G-Codes:    Jeani Hawking M January 23, 2015, 4:17 PM

## 2014-12-27 NOTE — Progress Notes (Signed)
Orthopedic Tech Progress Note Patient Details:  [name] 30-Apr-1945 [id number]  Patient ID: Isaiah Rogers, male   DOB: [birth year], 69 y.o.   MRN: [id number] Called in bio-tech brace order; spoke with Maudie Mercury, Courtany Mcmurphy 12/27/2014, 9:36 AM

## 2014-12-27 NOTE — Progress Notes (Addendum)
Received to room from PACU.  No english spoken and no family at bedside.  Isaiah Rogers is awake and alert asking for his phone.  Settled into room, patient guide in spanish provided and oriented to safety plan and routine with the aide of the interpreter service.

## 2014-12-27 NOTE — Evaluation (Signed)
Physical Therapy Evaluation Patient Details Name: Isaiah Rogers MRN: [id number] DOB: 08-26-1945 Today's Date: 12/27/2014   History of Present Illness  Pt is a 69 y/o male s/p L4 laminectomy, radical bilateral L4-L5 facetectomy, bilateral L5 foraminotomies, and L4-L5 fusion.  PMH of HTN, stroke, arthritis, and DM.  Clinical Impression  Pt is s/p L4-L5 decompression and fusion.  Pt presents with deficits and indicated below.  Pt speaks Spanish and does not speak any [place].  Telephone interpreter services were used during this session.  Pt has been educated on spinal precautions but due to communication barrier, it is unclear if pt fully understands.  Further PT is necessary in order to ensure safety with functional mobility and to continue educating regarding spinal precautions, proper use of brace, and safe activity at home.    Follow Up Recommendations Home health PT, Supervision/Assistance - 24 hour    Equipment Recommendations  Rolling walker with 5" wheels    Recommendations for Other Services       Precautions / Restrictions Precautions Precautions: Back;Fall Precaution Booklet Issued: No Precaution Comments: precautions verbalized to pt via telephone translator but unsure of pt understanding Required Braces or Orthoses: Spinal Brace Restrictions Weight Bearing Restrictions: No      Mobility  Bed Mobility Overal bed mobility: Needs Assistance Bed Mobility: Rolling;Sidelying to Sit Rolling: Min assist (visual cues and assist to bend knees and log roll; bed rails) Sidelying to sit: Modified independent (Device/Increase time) (bed rails)          Transfers Overall transfer level: Needs assistance Equipment used: None Transfers: Sit to/from Stand Sit to Stand: Supervision (increased time; tactile/visual cueing)            Ambulation/Gait Ambulation/Gait assistance: Min guard (for safety and tendency to lean L) Ambulation Distance (Feet): 220 Feet Assistive  device: Rolling walker (2 wheeled) Gait Pattern/deviations: Step-through pattern;Drifts right/left (tendency to lean L)   Gait velocity interpretation: Below normal speed for age/gender    Stairs            Wheelchair Mobility    Modified Rankin (Stroke Patients Only)       Balance Overall balance assessment: Needs assistance Sitting-balance support: No upper extremity supported;Feet supported Sitting balance-Leahy Scale: Good     Standing balance support: During functional activity;No upper extremity supported;Bilateral upper extremity supported Standing balance-Leahy Scale: Fair                               Pertinent Vitals/Pain Pain Assessment: Faces Faces Pain Scale: Hurts even more Pain Location: back Pain Descriptors / Indicators: Grimacing;Guarding Pain Intervention(s): Monitored during session    Home Living Family/patient expects to be discharged to:: Private residence Living Arrangements: Other relatives Available Help at Discharge: Family Type of Home: House Home Access: Stairs to enter   Secretary/administrator of Steps: 2 Home Layout: One level Home Equipment: None Additional Comments: home setup information provided by family    Prior Function Level of Independence: Independent         Comments: prior to admission was living alone; plan is to discharge with family     Hand Dominance        Extremity/Trunk Assessment   Upper Extremity Assessment: Overall WFL for tasks assessed           Lower Extremity Assessment: Overall WFL for tasks assessed         Communication   Communication: Prefers language other than English (  Spanish)  Cognition Arousal/Alertness: Awake/alert Behavior During Therapy: WFL for tasks assessed/performed Overall Cognitive Status: Difficult to assess                      General Comments General comments (skin integrity, edema, etc.): overall did not appear to be limited by pain;  able to walk in room without assistive device but with impaired balance and decreased safety    Exercises        Assessment/Plan    PT Assessment Patient needs continued PT services  PT Diagnosis Acute pain;Difficulty walking   PT Problem List Decreased balance;Decreased coordination;Decreased knowledge of use of DME;Decreased safety awareness;Decreased knowledge of precautions;Pain  PT Treatment Interventions DME instruction;Gait training;Stair training;Functional mobility training;Therapeutic activities;Therapeutic exercise;Neuromuscular re-education;Patient/family education   PT Goals (Current goals can be found in the Care Plan section) Acute Rehab PT Goals Patient Stated Goal: none stated (language barrier) PT Goal Formulation: Patient unable to participate in goal setting (language barrier) Time For Goal Achievement: 01/10/15 Potential to Achieve Goals: Good    Frequency Min 5X/week   Barriers to discharge        Co-evaluation               End of Session Equipment Utilized During Treatment: Gait belt;Back brace Activity Tolerance: Patient tolerated treatment well Patient left: in chair;with call bell/phone within reach;with nursing/sitter in room           Time: 1610-9604     Charges:   PT Evaluation $Initial PT Evaluation Tier I: 1 Procedure     PT G CodesMarchelle Folks Tocci 13-Jan-2015, 3:11 PM  Arnoldo Morale, SPT 2015-01-13 4:09 PM   Charlotte Crumb, PT DPT  [phone]

## 2014-12-27 NOTE — Progress Notes (Signed)
Filed Vitals:   12/26/14 1948 12/26/14 2010 12/27/14 0121 12/27/14 0634  BP:  134/76 121/60 118/52  Pulse:  60 80 70  Temp: 98.6 F (37 C) 98 F (36.7 C) 98.4 F (36.9 C) 98.5 F (36.9 C)  TempSrc:  Oral Oral Oral  Resp:  Height:   (1.651 m)    Weight:  60.7 kg (133 lb 13.1 oz)    SpO2:  98% 100% 99%    CBC  Recent Labs  12/26/14 2118  WBC 11.5*  HGB 11.7*  HCT 35.5*  PLT 206   BMET  Recent Labs  12/26/14 2118  CREATININE 0.98    Patient resting comfortably in bed. Has not yet been out of bed and mobilized. Nursing staff to contact biotech to obtain lumbar brace. Nursing staff to DC Foley once mobilizing.  Plan: Mobilize and ambulate today. Continue to progress through postoperative recovery.  Hewitt Shorts, MD 12/27/2014, 8:23 AM

## 2014-12-28 NOTE — Progress Notes (Signed)
Occupational Therapy Treatment Patient Details Name: Isaiah Rogers MRN: [id number] DOB: Feb 03, 1946 Today's Date: 12/28/2014    History of present illness Pt is a 69 y/o male s/p L4 laminectomy, radical bilateral L4-L5 facetectomy, bilateral L5 foraminotomies, and L4-L5 fusion.  PMH of HTN, stroke, arthritis, and DM.   OT comments  Patient with some progress today. Limited by pain and declined mobility. Issued a Sports administrator and educated on its uses via interpreter line. Also reviewed back precautions, don/doff brace, and verbal instruction on BADLs. OT will continue to follow.   Follow Up Recommendations  Home health OT;Supervision/Assistance - 24 hour    Equipment Recommendations  None recommended by OT    Recommendations for Other Services      Precautions / Restrictions Precautions Precautions: Back;Fall Precaution Booklet Issued: No Precaution Comments: pt educated on precautions and use of brace; verbalized and demonstrated understanding Required Braces or Orthoses: Spinal Brace Spinal Brace: Lumbar corset Restrictions Weight Bearing Restrictions: No       Mobility Bed Mobility Overal bed mobility: Needs Assistance Bed Mobility: Rolling;Sidelying to Sit Rolling: Min guard Sidelying to sit: Min guard       General bed mobility comments: verbal cueing to bend knee and log roll/prevent twisting  Transfers Overall transfer level: Needs assistance   Transfers: Sit to/from Stand Sit to Stand: Min guard         General transfer comment: verbal cues for hand placement and erect posture    Balance Overall balance assessment: Needs assistance Sitting-balance support: Feet supported;No upper extremity supported Sitting balance-Leahy Scale: Good     Standing balance support: During functional activity Standing balance-Leahy Scale: Fair                     ADL Overall ADL's : Needs assistance/impaired                                        General ADL Comments: Reviewed back precautions, how to don/doff brace, safety with ADLs and IADLs reviewed with pt  Issued a reacher and isntructed on its use via interpreter line. Patient declined mobility at this time due to pain.      Vision                     Perception     Praxis      Cognition   Behavior During Therapy: WFL for tasks assessed/performed Overall Cognitive Status: Within Functional Limits for tasks assessed                       Extremity/Trunk Assessment               Exercises     Shoulder Instructions       General Comments      Pertinent Vitals/ Pain       Pain Assessment: 0-10 Pain Score: 5  Pain Location: back Pain Descriptors / Indicators: Aching;Sore Pain Intervention(s): Limited activity within patient's tolerance;Monitored during session  Home Living                                          Prior Functioning/Environment              Frequency Min 2X/week     Progress  Toward Goals  OT Goals(current goals can now be found in the care plan section)  Progress towards OT goals: Progressing toward goals  Acute Rehab OT Goals Patient Stated Goal: none stated   Plan Discharge plan remains appropriate    Co-evaluation                 End of Session     Activity Tolerance Patient limited by pain   Patient Left in bed;with call bell/phone within reach   Nurse Communication Other (comment) (role of OT)        Time: 9147-8295 OT Time Calculation (min): 18 min  Charges: OT General Charges $OT Visit: 1 Procedure OT Treatments $Self Care/Home Management : 8-22 mins  Early, Leila A 12/28/2014, 11:34 AM

## 2014-12-28 NOTE — Progress Notes (Signed)
Physical Therapy Treatment Patient Details Name: Isaiah Rogers MRN: [id number] DOB: 1945/05/06 Today's Date: 12/28/2014    History of Present Illness Pt is a 69 y/o male s/p L4 laminectomy, radical bilateral L4-L5 facetectomy, bilateral L5 foraminotomies, and L4-L5 fusion.  PMH of HTN, stroke, arthritis, and DM.    PT Comments    Pt alert/oriented and willing to participate with PT.  He was able to tolerate ambulation using RW and ascending/descending 12 stairs without being limited by pain.  Pt noting fear of mobilizing without assistance, which is not atypical given the circumstances.  Pt has been educated on spinal precautions and application/use of lumbar brace, verbalizing and demonstrating good understanding though he required occasional cueing to prevent twisting. Continue PT POC and progress as able to continue improving safety with functional mobility and to ensure understanding of and compliance with precautions.  Pt is on track for d/c home with family.   Follow Up Recommendations  Home health PT     Equipment Recommendations  Rolling walker with 5" wheels    Recommendations for Other Services       Precautions / Restrictions Precautions Precautions: Back;Fall Precaution Booklet Issued: No Precaution Comments: pt educated on precautions and use of brace; verbalized and demonstrated understanding Required Braces or Orthoses: Spinal Brace Spinal Brace: Lumbar corset Restrictions Weight Bearing Restrictions: No    Mobility  Bed Mobility Overal bed mobility: Needs Assistance Bed Mobility: Rolling;Sidelying to Sit Rolling: Min guard Sidelying to sit: Min guard       General bed mobility comments: verbal cueing to bend knee and log roll/prevent twisting  Transfers Overall transfer level: Needs assistance   Transfers: Sit to/from Stand Sit to Stand: Min guard         General transfer comment: verbal cues for hand placement and erect  posture  Ambulation/Gait Ambulation/Gait assistance: Min guard Ambulation Distance (Feet): 300 Feet Assistive device: Rolling walker (2 wheeled) Gait Pattern/deviations: Step-through pattern;Drifts right/left (leaning R; guarded movement)   Gait velocity interpretation: Below normal speed for age/gender General Gait Details: pt notes he is fearful of walking alone   Stairs Stairs: Yes Stairs assistance: Min guard Stair Management: One rail Left;Alternating pattern Number of Stairs: 12 General stair comments: pt able to ascend/descend stairs with good technique and no significant increase in pain  Wheelchair Mobility    Modified Rankin (Stroke Patients Only)       Balance Overall balance assessment: Needs assistance Sitting-balance support: Feet supported;No upper extremity supported Sitting balance-Leahy Scale: Good     Standing balance support: During functional activity Standing balance-Leahy Scale: Fair                      Cognition Arousal/Alertness: Awake/alert Behavior During Therapy: WFL for tasks assessed/performed Overall Cognitive Status: Within Functional Limits for tasks assessed                      Exercises      General Comments General comments (skin integrity, edema, etc.): overall not limited by pain; appears to understand precautions and brace application      Pertinent Vitals/Pain Pain Assessment: 0-10 Pain Score: 4  Pain Location: back (pain increased especially with coughing) Pain Descriptors / Indicators: Aching;Grimacing;Guarding Pain Intervention(s): Monitored during session    Home Living                      Prior Function  PT Goals (current goals can now be found in the care plan section) Acute Rehab PT Goals Patient Stated Goal: none stated  PT Goal Formulation: With patient Time For Goal Achievement: 01/10/15 Potential to Achieve Goals: Good Progress towards PT goals: Progressing  toward goals    Frequency  Min 5X/week    PT Plan Current plan remains appropriate    Co-evaluation             End of Session Equipment Utilized During Treatment: Back brace Activity Tolerance: Patient tolerated treatment well Patient left: in chair;with call bell/phone within reach;with nursing/sitter in room     Time: 0812-0838 PT Time Calculation (min) (ACUTE ONLY): 26 min  Charges:  $Gait Training: 8-22 mins $Therapeutic Activity: 8-22 mins                    G Codes:      Amanda Tocci Jan 06, 2015, 8:59 AM   Arnoldo Morale, SPT

## 2014-12-28 NOTE — Progress Notes (Signed)
Patient ID: Isaiah Rogers, male   DOB: [birth year], 69 y.o.   MRN: [id number] Vital signs are stable Patient offers no significant complaints Dressing has been intact He is ambulating with help Is unclear that he has transportation available for him today Likely discharge tomorrow

## 2014-12-29 MED ORDER — OXYCODONE-ACETAMINOPHEN 5-325 MG PO TABS: 1.0000 | ORAL_TABLET | ORAL | Status: DC | PRN

## 2014-12-29 MED ORDER — DIAZEPAM 5 MG PO TABS: 5.0000 mg | ORAL_TABLET | Freq: Four times a day (QID) | ORAL | Status: DC | PRN

## 2014-12-29 NOTE — Discharge Summary (Signed)
Physician Discharge Summary  Patient ID: Isaiah Rogers MRN: [id number] DOB/AGE: 68-Apr-1947 68 y.o.  Admit date: 12/26/2014 Discharge date: 12/29/2014  Admission Diagnoses: Spondylolisthesis, L4-5  Discharge Diagnoses: Same Active Problems:   Spondylolisthesis, grade 2   Spondylisthesis   Discharged Condition: Stable  Hospital Course:  [name] is a 69 y.o. male admitted after elective decompression/fusion at L4-5. He had an unremarkable hospital course. He had pain, controlled with percocet PO, was ambulating with minimal assistance, and was voiding normally. He was evaluated by PT/OT and home therapy was recommended.  Treatments: Surgery - L4-5 decompression/fusion  Discharge Exam: Blood pressure 139/71, pulse 83, temperature 99.2 F (37.3 C), temperature source Oral, resp. rate 18, height  (1.651 m), weight 60.7 kg (133 lb 13.1 oz), SpO2 96 %. Awake, alert, oriented Speech fluent, appropriate CN grossly intact 5/5 BUE/BLE Wound c/d/i  Disposition: Home  Discharge Instructions    Ambulatory referral to Home Health    Complete by:  As directed   Please evaluate Isaiah Rogers for admission to [facility].  Disciplines requested: Physical Therapy and Occupational Therapy  Services to provide: Strengthening Exercises and Evaluate  Physician to follow patient's care (the person listed here will be responsible for signing ongoing orders): Referring provider  Requested Start of Care Date: Tomorrow  I certify that this patient is under my care and that I, or a Nurse Practitioner or Physician's Assistant working with me, had a face-to-face encounter that meets the physician face-to-face requirements with patient on 12/29/14. The encounter with the patient was in whole, or in part for the following medical condition(s) which is the primary reason for home health care (List medical condition). Spondylolisthesis, L4-5 s/p fusion  Does the patient have Medicare or  Medicaid?:  Yes  The encounter with the patient was in whole, or in part, for the following medical condition, which is the primary reason for home health care:  spondylolisthesis  Reason for Medically Necessary Home Health Services:  Therapy- Home Adaptation to Facilitate Safety  My clinical findings support the need for the above services:  Pain interferes with ambulation/mobility  I certify that, based on my findings, the following services are medically necessary home health services:  Physical therapy  Further, I certify that my clinical findings support that this patient is homebound due to:  Pain interferes with ambulation/mobility            Medication List    STOP taking these medications        aspirin EC 325 MG tablet      TAKE these medications        diazepam 5 MG tablet  Commonly known as:  VALIUM  Take 1 tablet (5 mg total) by mouth every 6 (six) hours as needed for muscle spasms.     ferrous sulfate 325 (65 FE) MG tablet  Take 325 mg by mouth daily with breakfast.     gabapentin 300 MG capsule  Commonly known as:  NEURONTIN  Take 300 mg by mouth 3 (three) times daily.     lisinopril 20 MG tablet  Commonly known as:  PRINIVIL,ZESTRIL  Take 20 mg by mouth daily.     oxyCODONE-acetaminophen 5-325 MG per tablet  Commonly known as:  PERCOCET/ROXICET  Take 1-2 tablets by mouth every 4 (four) hours as needed for moderate pain.     pravastatin 20 MG tablet  Commonly known as:  PRAVACHOL  Take 20 mg by mouth at bedtime.     rOPINIRole  0.5 MG tablet  Commonly known as:  REQUIP  Take 0.5 mg by mouth at bedtime.     SALONPAS Pads  Apply 1 each topically daily as needed (back pain).     VITAMIN B12 PO  Take 1 tablet by mouth at bedtime.           Follow-up Information    Follow up with [facility], NEELESH, C, MD In 2 weeks.   Specialty:  Neurosurgery   Contact information:   [address] [place] [address] [phone]        Signed: Lisbeth Renshaw, Salena Saner 12/29/2014, 8:54 AM

## 2014-12-29 NOTE — Care Management Note (Signed)
Case Management Note  Patient Details  Name: Isaiah Rogers Date of Birth: 1945/08/24  Subjective/Objective:                    Action/Plan: Pt admitted for a L4-5 fusion. Pt lives home alone but is going to discharge home with his cousin. Pt and cousin do not speak [place]. MD ordered home health PT. Pt and Cousin given home health list and went over the list with an interpreter from [employer]. Also went over that a PT will come into the home to work with the patient and that they will contact them before initial visit with the interpretor and cousin.  Pt/ family chose [facility]. Miranda with [facility] notified and accepted the referral. Miranda given the phone number [phone]) and address where the patient will be staying: [address] in [place], [place]. Bedside RN aware.  Expected Discharge Date:                  Expected Discharge Plan:  Home w Home Health Services  In-House Referral:     Discharge planning Services  CM Consult  Post Acute Care Choice:    Choice offered to:  Patient  DME Arranged:    DME Agency:     HH Arranged:  PT HH Agency:  [facility]  Status of Service:  In process, will continue to follow  Medicare Important Message Given:  Yes-second notification given Date Medicare IM Given:    Medicare IM give by:    Date Additional Medicare IM Given:    Additional Medicare Important Message give by:     If discussed at Long Length of Stay Meetings, dates discussed:    Additional Comments:  Florene Glen, RN 12/29/2014, 11:02 AM

## 2014-12-29 NOTE — Progress Notes (Signed)
Occupational Therapy Treatment Patient Details Name: Isaiah Rogers MRN: [id number] DOB: [birth year] Today's Date: 12/29/2014    History of present illness Pt is a 69 y/o male s/p L4 laminectomy, radical bilateral L4-L5 facetectomy, bilateral L5 foraminotomies, and L4-L5 fusion.  PMH of HTN, stroke, arthritis, and DM.   OT comments  Pt. Up in room ambulating with out DME upon arrival.  Moving well with no LOB noted.  Educated on need for RW, he nodded yes.  Able to return demo of don/doff brace and use of A/E for LB ADLS.  Will continue to follow acutely.    Follow Up Recommendations  Home health OT;Supervision/Assistance - 24 hour    Equipment Recommendations  None recommended by OT    Recommendations for Other Services      Precautions / Restrictions Precautions Precautions: Back;Fall Precaution Booklet Issued: No Precaution Comments: pt reeducated on precautions via demonstration, pt with return verbal understanding Required Braces or Orthoses: Spinal Brace Spinal Brace: Applied in sitting position;Lumbar corset Restrictions Weight Bearing Restrictions: No       Mobility Bed Mobility Overal bed mobility: Needs Assistance Bed Mobility: Rolling;Sidelying to Sit Rolling: Min guard Sidelying to sit: Supervision       General bed mobility comments: amb. in room and up in recliner at end of session  Transfers Overall transfer level: Needs assistance Equipment used: Rolling walker (2 wheeled) Transfers: Sit to/from [employer] Sit to Stand: Supervision Stand pivot transfers: Supervision       General transfer comment: v/c's for safe hand placement    Balance Overall balance assessment: Needs assistance         Standing balance support: No upper extremity supported;During functional activity Standing balance-Leahy Scale: Fair Standing balance comment: pt able to stand and urinate without diffuclties                   ADL Overall ADL's :  Needs assistance/impaired     Grooming: Wash/dry hands;Wash/dry face;Oral care;Supervision/safety;Standing         Lower Body Bathing Details (indicate cue type and reason): simulated with use of reacher prn, pt. limited by visual demonstration of pain, family assist or LH sponge will provide A        Lower Body Dressing Details (indicate cue type and reason): reviewed use of reacher with pt. for LB ADLS Toilet Transfer: Radiographer, therapeutic Details (indicate cue type and reason): no A/E pt. able to urinate in standing, and access reg. height toilet without DME over it  Toileting- Clothing Manipulation and Hygiene: Supervision/safety;Sit to/from stand       Functional mobility during ADLs: Supervision/safety;Rolling walker General ADL Comments: Reviewed back precautions, how to don/doff brace, safety with ADLs and IADLs reviewed with pt  use of reacher for LB ADLS.  pt. found amb. in room without RW when pointed to RW he laughed and gestured that he knew he was supposed to have it.  performed all grooming in standing with S.  no LOB noted.  Able to don/doff brace without any cues requried      Vision                     Perception     Praxis      Cognition   Behavior During Therapy: [facility] for tasks assessed/performed Overall Cognitive Status: Within Functional Limits for tasks assessed  Extremity/Trunk Assessment               Exercises     Shoulder Instructions       General Comments      Pertinent Vitals/ Pain       Pain Assessment: No/denies pain Pain Score: 5  Pain Location: back Pain Descriptors / Indicators: Radiating Pain Intervention(s): Limited activity within patient's tolerance  Home Living                                          Prior Functioning/Environment              Frequency Min 2X/week     Progress Toward Goals  OT Goals(current goals can now be found in  the care plan section)  Progress towards OT goals: Progressing toward goals  Acute Rehab OT Goals Patient Stated Goal: none  Plan Discharge plan remains appropriate    Co-evaluation                 End of Session Equipment Utilized During Treatment: Back brace   Activity Tolerance Patient tolerated treatment well   Patient Left in chair;with call bell/phone within reach   Nurse Communication          Time: 1610-9604 OT Time Calculation (min): 11 min  Charges: OT Treatments $Self Care/Home Management : 8-22 mins  Robet Leu, COTA/L 12/29/2014, 9:15 AM

## 2014-12-29 NOTE — Care Management Important Message (Signed)
Important Message  Patient Details  Name: Isaiah Rogers MRN: [id number] Date of Birth: July 29, 1945   Medicare Important Message Given:  Yes-second notification given    Orson Aloe 12/29/2014, 9:55 AM

## 2014-12-29 NOTE — Progress Notes (Signed)
Physical Therapy Treatment Patient Details Name: Isaiah Rogers MRN: [id number] DOB: 25-Mar-1946 Today's Date: 12/29/2014    History of Present Illness Pt is a 69 y/o male s/p L4 laminectomy, radical bilateral L4-L5 facetectomy, bilateral L5 foraminotomies, and L4-L5 fusion.  PMH of HTN, stroke, arthritis, and DM.    PT Comments    Pt with improved pain with ambulation. Pt with good home set up and support. Remains appropriate for d/c home when medially stable.  Follow Up Recommendations  Home health PT     Equipment Recommendations  Rolling walker with 5" wheels    Recommendations for Other Services       Precautions / Restrictions Precautions Precautions: Back;Fall Precaution Booklet Issued: No Precaution Comments: pt reeducated on precautions via demonstration, pt with return verbal understanding Required Braces or Orthoses: Spinal Brace Spinal Brace: Applied in sitting position;Lumbar corset Restrictions Weight Bearing Restrictions: No    Mobility  Bed Mobility Overal bed mobility: Needs Assistance Bed Mobility: Rolling;Sidelying to Sit Rolling: Min guard Sidelying to sit: Supervision       General bed mobility comments: pt with definite use of hands and bed rail  Transfers Overall transfer level: Needs assistance Equipment used: Rolling walker (2 wheeled) Transfers: Sit to/from Stand Sit to Stand: Min guard         General transfer comment: v/c's for safe hand placement  Ambulation/Gait Ambulation/Gait assistance: Min guard Ambulation Distance (Feet): 300 Feet Assistive device: Rolling walker (2 wheeled) Gait Pattern/deviations: Step-through pattern Gait velocity: wfl Gait velocity interpretation: at or above normal speed for age/gender General Gait Details: pt with R LE pain but reports walking to decrease pain and desired to ambulate more   Stairs Stairs: Yes Stairs assistance: Min guard Stair Management: One rail Right Number of Stairs:  6 General stair comments: noted difficulty with ascending with R LE  Wheelchair Mobility    Modified Rankin (Stroke Patients Only)       Balance Overall balance assessment: Needs assistance         Standing balance support: No upper extremity supported;During functional activity Standing balance-Leahy Scale: Fair Standing balance comment: pt able to stand and urinate without diffuclties                    Cognition Arousal/Alertness: Awake/alert Behavior During Therapy: WFL for tasks assessed/performed Overall Cognitive Status: Within Functional Limits for tasks assessed                      Exercises      General Comments        Pertinent Vitals/Pain Pain Assessment: 0-10 Pain Score: 5  Pain Location: back Pain Descriptors / Indicators: Radiating Pain Intervention(s): Limited activity within patient's tolerance    Home Living                      Prior Function            PT Goals (current goals can now be found in the care plan section) Acute Rehab PT Goals Patient Stated Goal: none Progress towards PT goals: Progressing toward goals    Frequency  Min 5X/week    PT Plan Current plan remains appropriate    Co-evaluation             End of Session Equipment Utilized During Treatment: Back brace Activity Tolerance: Patient tolerated treatment well Patient left: in chair;with call bell/phone within reach;with nursing/sitter in room     Time: 4098-1191  PT Time Calculation (min) (ACUTE ONLY): 26 min  Charges:  $Gait Training: 23-37 mins                    G Codes:      Marcene Brawn 12/29/2014, 9:06 AM  Lewis Shock, PT, DPT Pager #: [phone] Office #: [phone]

## 2014-12-29 NOTE — Progress Notes (Signed)
Pt is being discharged home. Discharge instructions were given to patient and family with telephonic interpreting service.

## 2015-01-01 ENCOUNTER — Encounter (HOSPITAL_COMMUNITY): Payer: Self-pay | Admitting: Neurosurgery

## 2015-01-08 ENCOUNTER — Encounter (HOSPITAL_COMMUNITY): Payer: Self-pay | Admitting: Neurosurgery

## 2016-11-09 ENCOUNTER — Ambulatory Visit: Payer: Medicare Other | Attending: Internal Medicine

## 2016-11-09 DIAGNOSIS — R262 Difficulty in walking, not elsewhere classified: Secondary | ICD-10-CM | POA: Diagnosis not present

## 2016-11-09 DIAGNOSIS — M6281 Muscle weakness (generalized): Secondary | ICD-10-CM | POA: Diagnosis present

## 2016-11-09 NOTE — Therapy (Signed)
[facility] PHYSICAL AND SPORTS MEDICINE [address] [place], [place], [address] Phone: [phone]   Fax:  [phone]  Physical Therapy Evaluation  Patient Details  Name: Isaiah Rogers MRN: [id number] Date of Birth: 1945/04/14 Referring Provider: Barbette ReichmannVishwanath Hande MD  Encounter Date: 11/09/2016      PT End of Session - 11/09/16 1146    Visit Number 1   Number of Visits 17   Date for PT Re-Evaluation 01/04/17   Authorization Type  1 / 10 G Codes   PT Start Time 1050   PT Stop Time 1140   PT Time Calculation (min) 50 min   Activity Tolerance Patient tolerated treatment well;No increased pain   Behavior During Therapy WFL for tasks assessed/performed      Past Medical History:  Diagnosis Date  . Arthritis    lumbar spondylosis  . Diabetes mellitus without complication (HCC)    told that he has diabetes but not treated   . Hypertension   . Stroke [facility](HCC)     History reviewed. No pertinent surgical history.  There were no vitals filed for this visit.       Subjective Assessment - 11/09/16 1106    Subjective Patient demonstrates increased balance difficulties, LE weakness, and running. Patient reports he performs he performs actions such as walking, ascending/decending the stairs, and household chores secondary to fear from losing his balance. Patient reports he is having increased foot numbness along bilateral feet. Patient reports he has no pain currently. Patient states his primary difficulty is walking.    Pertinent History Patient has a history of CVA 5 years ago, lumbar fusion L4-5 2 years ago, patient has a hx of diabetes,   Limitations Standing;Lifting   How long can you walk comfortably? 15min   Currently in Pain? No/denies   Pain Score 0-No pain  Pain at worst 4 /10 (at end of the day)   Pain Location Foot  plantar aspect of the foot along 1-3rd MTP joints   Pain Orientation Right;Left   Pain Descriptors / Indicators  Burning   Pain Type Chronic pain   Pain Onset More than a month ago   Pain Frequency Intermittent            [facility] PT Assessment - 11/09/16 1057      Assessment   Medical Diagnosis Bilateral Leg Weakness    Referring Provider Barbette ReichmannVishwanath Hande MD   Onset Date/Surgical Date 04/11/14   Hand Dominance Right   Next MD Visit unknown   Prior Therapy yes     Balance Screen   Has the patient fallen in the past 6 months No   Has the patient had a decrease in activity level because of a fear of falling?  No   Is the patient reluctant to leave their home because of a fear of falling?  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, cleaning    Leisure watch tv, sleep     Cognition   Overall Cognitive Status Within Functional Limits for tasks assessed     Observation/Other  Assessments   Observations Spanish 1st language     Sensation   Light Touch Appears Intact  All LE dermatomes: WNL     Functional Tests   Functional tests Sit to Stand     Sit to Stand   Comments Poor hip control with increase knee valgus througout movement     Posture/Postural Control   Posture Comments Decreased weight bearing onto L LE throughout weight bearing     ROM / Strength   AROM / PROM / Strength AROM;Strength     AROM   Overall AROM  Within functional limits for tasks performed   AROM Assessment Site Lumbar   Lumbar Flexion WNL   Lumbar Extension 50% limited   Lumbar - Right Side Bend WNL   Lumbar - Left Side Bend WNL   Lumbar - Right Rotation WNL   Lumbar - Left Rotation WNL     Strength   Strength Assessment Site  Hip;Ankle;Knee;Lumbar   Right/Left Hip Right;Left   Right Hip Flexion 4+/5   Right Hip Extension 4/5   Right Hip ABduction 4/5   Right Hip ADduction 4/5   Left Hip Flexion 4-/5   Left Hip Extension 4-/5   Left Hip ABduction 3+/5   Left Hip ADduction 3+/5   Right/Left Knee Right;Left   Right Knee Flexion 5/5   Right Knee Extension 5/5   Left Knee Flexion 4-/5   Left Knee Extension 4-/5   Right/Left Ankle Right;Left   Right Ankle Dorsiflexion 4-/5   Right Ankle Plantar Flexion 4/5   Left Ankle Dorsiflexion 4-/5   Left Ankle Plantar Flexion 4/5   Lumbar Flexion 4/5   Lumbar Extension 4/5     Transfers   Five time sit to stand comments  16sec     Ambulation/Gait   Gait Pattern Decreased stance time - right;Step-to pattern;Narrow base of support  Pt ambulates with decreased knee flexion throughout motion     Standardized Balance Assessment   Standardized Balance Assessment Timed Up and Go Test;Five Times Sit to Stand;10 meter walk test   10 Meter Walk .7489m/s     Timed Up and Go Test   TUG Comments 13sec      Observation: SLS R: 3 sec; SLS L: 2sec Tandem stance: 5 sec B  Objective measurements completed on examination: See above findings.   TREATMENT: Sit to stand -- x 10  Squats -- x 10  Lunges -- x 10 B Hip abduction in standing -- x 10  Hip extension in standing -- x 10   Patient demonstrates no increase in pain throughout session            PT Education - 11/09/16 1145    Education provided Yes   Education Details HEP: squats, hip ext, hip abd, lunges in standing   Person(s) Educated Patient   Methods Explanation;Demonstration   Comprehension Verbalized understanding;Returned demonstration             PT Long Term Goals - 11/09/16 1153      PT LONG TERM GOAL #1   Title Patient will be independent with HEP to continue benefits of therapy after discharge.   Baseline Dependent for exercise performance and progression   Time 8   Period Weeks    Status New   Target Date 01/04/17     PT LONG TERM GOAL #2   Title Patient will improve TUG to under 9 sec to demonstrate significant improvement in balance and decrease in fall risk  Baseline 13sec   Time 8   Period Weeks   Status New   Target Date 01/04/17     PT LONG TERM GOAL #3   Title Patient will improve 5XSTS to under 10sec to demonstrate significant improvement in LE strength and ability to stand up.    Baseline 16sec   Time 8   Period Weeks   Status New   Target Date 01/04/17     PT LONG TERM GOAL #4   Title Patient will improve to >42m/s to demonstrate significant improvement in walking ability and greater safety with community ambulation   Baseline .61m/s   Time 8   Period Weeks   Status New   Target Date 01/04/17                Plan - 2016-12-08 1147    Clinical Impression Statement Patient is a 71 yo right hand dominant male presenting with LE weakness and balance difficulties from insidious onset. Patient's increased balance and LE strength difficulties are demonstrated by poor scores on the TUG, 5XSTS, , and decreased single leg stance/tandem stance. Patient's back pain most likely secondary to weakness secondary to no pain with performance of lumbar motions. Patient demonstrates poor muscular strength endurance and coordination; pt will benefit from further skilled therapy to return to prior level of function   History and Personal Factors relevant to plan of care: Previous Lumbar fusion; Previous CVA   Clinical Presentation Evolving   Clinical Presentation due to: Patient demonstrates no improvement of symptoms   Clinical Decision Making Moderate   Rehab Potential Good   Clinical Impairments Affecting Rehab Potential (-) Age (+) highly motivated   PT Frequency 2x / week   PT Duration 8 weeks   PT Treatment/Interventions Therapeutic activities;Therapeutic exercise;Balance training;Iontophoresis 4mg /ml Dexamethasone;Aquatic Therapy;Electrical  Stimulation;Cryotherapy;Ultrasound;Neuromuscular re-education;Patient/family education;Gait training;Stair training;Manual techniques   PT Next Visit Plan Progress strengthening and balance exercises   Consulted and Agree with Plan of Care Patient      Patient will benefit from skilled therapeutic intervention in order to improve the following deficits and impairments:  Abnormal gait, Decreased endurance, Decreased range of motion, Decreased strength, Decreased balance, Pain, Decreased coordination, Decreased mobility, Difficulty walking  Visit Diagnosis: Difficulty in walking, not elsewhere classified - Plan: PT plan of care cert/re-cert  Muscle weakness (generalized) - Plan: PT plan of care cert/re-cert      G-Codes - 08-Dec-2016 1159    Functional Assessment Tool Used (Outpatient Only) Clinical Judgement, TUG, 5XSTS,   Functional Limitation Mobility: Walking and moving around   Mobility: Walking and Moving Around Current Status (R6045) At least 20 percent but less than 40 percent impaired, limited or restricted   Mobility: Walking and Moving Around Goal Status (W0981) At least 1 percent but less than 20 percent impaired, limited or restricted       Problem List Patient Active Problem List   Diagnosis Date Noted  . Spondylolisthesis, grade 2 12/26/2014  . Spondylisthesis 12/26/2014    [name], PT DPT 12/08/16, 12:22 PM  [facility] [facility] PHYSICAL AND SPORTS MEDICINE [address], [place], [address] Phone: [phone]   Fax:  [phone]  Name: Isaiah Rogers MRN: [id number] Date of Birth: 08-May-1945

## 2016-11-14 ENCOUNTER — Ambulatory Visit: Payer: Medicare Other

## 2016-11-14 DIAGNOSIS — R262 Difficulty in walking, not elsewhere classified: Secondary | ICD-10-CM

## 2016-11-14 DIAGNOSIS — M6281 Muscle weakness (generalized): Secondary | ICD-10-CM

## 2016-11-14 NOTE — Therapy (Signed)
[facility] [address] [place], [place], [address] Phone: [phone]   Fax:  [phone]  Physical Therapy Treatment  Patient Details  Name: Isaiah Rogers MRN: [id number] Date of Birth: 13-May-1945 Referring Provider: Barbette ReichmannVishwanath Hande MD  Encounter Date: 11/14/2016      PT End of Session - 11/14/16 1144    Visit Number 2   Number of Visits 17   Date for PT Re-Evaluation 01/04/17   Authorization Type  2 / 10 G Codes   PT Start Time 1120   PT Stop Time 1200   PT Time Calculation (min) 40 min   Activity Tolerance Patient tolerated treatment well;No increased pain   Behavior During Therapy WFL for tasks assessed/performed      Past Medical History:  Diagnosis Date  . Arthritis    lumbar spondylosis  . Diabetes mellitus without complication (HCC)    told that he has diabetes but not treated   . Hypertension   . Stroke [facility](HCC)     History reviewed. No pertinent surgical history.  There were no vitals filed for this visit.      Subjective Assessment - 11/14/16 1128    Subjective Patient reports no major changes since the previous visit. Patient reports the bottom of his feet continue to feel numb.    Pertinent History Patient has a history of CVA 5 years ago, lumbar fusion L4-5 2 years ago, patient has a hx of diabetes,   Limitations Standing;Lifting   How long can you walk comfortably? 15min   Currently in Pain? No/denies   Pain Onset More than a month ago       TREATMENT Therapeutic Exercise: Sit to stand - x10, x 15 with cueing on speed of performance Leg Press at [facility] - 65# x10 , 85# x 10  Side Stepping across airex Beam - x4 down and back  Standing feet together head turns R/L B Directions - x 15; Eyes closed 3 x 30secs  Hip abduction in standing at hip machine - 2 x 15 B Marches on balance stones - x 20 B Ball toss with patient in widened tandem stance - x 20   Patient demonstrates  increased LE fatigue at end of session      PT Education - 11/14/16 1143    Education provided Yes   Education Details Form/technique with exercises   Person(s) Educated Patient   Methods Explanation;Demonstration   Comprehension Verbalized understanding;Returned demonstration             PT Long Term Goals - 11/09/16 1153      PT LONG TERM GOAL #1   Title Patient will be independent with HEP to continue benefits of therapy after discharge.   Baseline Dependent for exercise performance and progression   Time 8   Period Weeks   Status New   Target Date 01/04/17     PT LONG TERM GOAL #2   Title Patient will improve TUG to under 9 sec to demonstrate significant improvement in balance and decrease in fall risk   Baseline 13sec   Time 8   Period Weeks   Status New   Target Date 01/04/17     PT LONG TERM GOAL #3   Title Patient will improve 5XSTS to under 10sec to demonstrate significant improvement in LE strength and ability to stand up.    Baseline 16sec   Time 8   Period Weeks   Status New   Target  Date 01/04/17     PT LONG TERM GOAL #4   Title Patient will improve to >60m/s to demonstrate significant improvement in walking ability and greater safety with community ambulation   Baseline .85m/s   Time 8   Period Weeks   Status New   Target Date 01/04/17               Plan - 11/14/16 1144    Clinical Impression Statement Introduced more balance and strengthening exercises to decrease future fall risk of the patient. Patient demonstrates increased balance difficulties in standing and requires intermittent UE support to maintain balance. Patient will benefit from further skilled therapy to return to prior level of function.    Rehab Potential Good   Clinical Impairments Affecting Rehab Potential (-) Age (+) highly motivated   PT Frequency 2x / week   PT Duration 8 weeks   PT Treatment/Interventions Therapeutic activities;Therapeutic exercise;Balance  training;Iontophoresis 4mg /ml Dexamethasone;Aquatic Therapy;Electrical Stimulation;Cryotherapy;Ultrasound;Neuromuscular re-education;Patient/family education;Gait training;Stair training;Manual techniques   PT Next Visit Plan Progress strengthening and balance exercises   Consulted and Agree with Plan of Care Patient      Patient will benefit from skilled therapeutic intervention in order to improve the following deficits and impairments:  Abnormal gait, Decreased endurance, Decreased range of motion, Decreased strength, Decreased balance, Pain, Decreased coordination, Decreased mobility, Difficulty walking  Visit Diagnosis: Difficulty in walking, not elsewhere classified  Muscle weakness (generalized)     Problem List Patient Active Problem List   Diagnosis Date Noted  . Spondylolisthesis, grade 2 12/26/2014  . Spondylisthesis 12/26/2014    [name], PT DPT 11/14/2016, 12:02 PM  [facility] PHYSICAL AND SPORTS MEDICINE [address] [place], [address] Phone: [phone]   Fax:  [phone]  Name: Isaiah Rogers MRN: [id number] Date of Birth: Oct 07, 1945

## 2016-11-16 ENCOUNTER — Ambulatory Visit: Payer: Medicare Other

## 2016-11-16 DIAGNOSIS — R262 Difficulty in walking, not elsewhere classified: Secondary | ICD-10-CM | POA: Diagnosis not present

## 2016-11-16 DIAGNOSIS — M6281 Muscle weakness (generalized): Secondary | ICD-10-CM

## 2016-11-16 NOTE — Therapy (Signed)
[facility] PH[facility]ICINE [address], [place], [address] Phone: [phone]   Fax:  [phone]  Physical Therapy Treatment  Patient Details  Name: Isaiah Rogers MRN: [id number] Date of Birth: 10/28/1945 Referring Provider: Barbette Reichmann MD  Encounter Date: 11/16/2016      PT End of Session - 11/16/16 0859    Visit Number 3   Number of Visits 17   Date for PT Re-Evaluation 01/04/17   Authorization Type  2 / 10 G Codes   PT Start Time 0830   PT Stop Time 0915   PT Time Calculation (min) 45 min   Activity Tolerance Patient tolerated treatment well;No increased pain   Behavior During Therapy WFL for tasks assessed/performed      Past Medical History:  Diagnosis Date  . Arthritis    lumbar spondylosis  . Diabetes mellitus without complication (HCC)    told that he has diabetes but not treated   . Hypertension   . Stroke [facility])     History reviewed. No pertinent surgical history.  There were no vitals filed for this visit.      Subjective Assessment - 11/16/16 0848    Subjective Patient reports no major changes since the previous. Patient reports slight soreness in his legs when performing exercises.    Pertinent History Patient has a history of CVA 5 years ago, lumbar fusion L4-5 2 years ago, patient has a hx of diabetes,   Limitations Standing;Lifting   How long can you walk comfortably?   Currently in Pain? No/denies   Pain Onset More than a month ago      TREATMENT Therapeutic Exercise: Leg Press at [facility] - 75# x15, 85# x 15 Knee extension at [facility] - x20 20#, x15 35# Hip abduction in standing at hip machine - 2 x 15 B Side stepping up onto airex pad from airex beam - x 12  Feet together balance with ball toss from airex pad - x30 tosses Step ups to 6" step without UE support - x 15  Step taps onto 12" step - x20    Patient demonstrates increased LE fatigue at end of session          PT Education - 11/16/16 0850    Education provided Yes   Education Details Form/technique with exercises   Person(s) Educated Patient   Methods Explanation;Demonstration   Comprehension Verbalized understanding;Returned demonstration             PT Long Term Goals - 11/09/16 1153      PT LONG TERM GOAL #1   Title Patient will be independent with HEP to continue benefits of therapy after discharge.   Baseline Dependent for exercise performance and progression   Time 8   Period Weeks   Status New   Target Date 01/04/17     PT LONG TERM GOAL #2   Title Patient will improve TUG to under 9 sec to demonstrate significant improvement in balance and decrease in fall risk   Baseline 13sec   Time 8   Period Weeks   Status New   Target Date 01/04/17     PT LONG TERM GOAL #3   Title Patient will improve 5XSTS to under 10sec to demonstrate significant improvement in LE strength and ability to stand up.    Baseline 16sec   Time 8   Period Weeks   Status New   Target Date 01/04/17     PT LONG  TERM GOAL #4   Title Patient will improve 10mWT to >1256m/s to demonstrate significant improvement in walking ability and greater safety with community ambulation   Baseline .446m/s   Time 8   Period Weeks   Status New   Target Date 01/04/17               Plan - 11/16/16 0903    Clinical Impression Statement Advanced exercise progression to perform with greater amount of resistance and greater amount of repetitions performed. Patient demonstrates decreased static/dynamic balance throughout session with increased postural sway and pt requirres UE support to perform exercises. Patient will benefit from further skilled therapy to improve limitations and return to prior level of function   Clinical Decision Making Moderate   Rehab Potential Good   Clinical Impairments Affecting Rehab Potential (-) Age (+) highly motivated   PT Frequency 2x / week   PT Duration 8 weeks   PT  Treatment/Interventions Therapeutic activities;Therapeutic exercise;Balance training;Iontophoresis 4mg /ml Dexamethasone;Aquatic Therapy;Electrical Stimulation;Cryotherapy;Ultrasound;Neuromuscular re-education;Patient/family education;Gait training;Stair training;Manual techniques   PT Next Visit Plan Progress strengthening and balance exercises   Consulted and Agree with Plan of Care Patient      Patient will benefit from skilled therapeutic intervention in order to improve the following deficits and impairments:  Abnormal gait, Decreased endurance, Decreased range of motion, Decreased strength, Decreased balance, Pain, Decreased coordination, Decreased mobility, Difficulty walking  Visit Diagnosis: Difficulty in walking, not elsewhere classified  Muscle weakness (generalized)     Problem List Patient Active Problem List   Diagnosis Date Noted  . Spondylolisthesis, grade 2 12/26/2014  . Spondylisthesis 12/26/2014    Myrene GalasWesley Latacha Texeira, PT DPT 11/16/2016, 9:17 AM  [facility] [address] [place], [place], [address] Phone: [phone]   Fax:  [phone]  Name: Isaiah AsalOscar Rogers MRN: [id number] Date of Birth: April 18, 1945

## 2016-11-21 ENCOUNTER — Ambulatory Visit: Payer: Medicare Other

## 2016-11-21 DIAGNOSIS — R262 Difficulty in walking, not elsewhere classified: Secondary | ICD-10-CM | POA: Diagnosis not present

## 2016-11-21 DIAGNOSIS — M6281 Muscle weakness (generalized): Secondary | ICD-10-CM

## 2016-11-21 NOTE — Therapy (Signed)
[facility] [facility] PH[facility] MEDIC[facility] [address] [place], [address] Phone: [phone]   Fax:  [phone]  Physical Therapy Treatment  Patient Details  Name: Isaiah Rogers MRN: [id number] Date of Birth: Mar 25, 1946 Referring Provider: Barbette Reichmann MD  Encounter Date: 11/21/2016      PT End of Session - 11/21/16 1037    Visit Number 4   Number of Visits 17   Date for PT Re-Evaluation 01/04/17   Authorization Type 4 / 10 G Codes   PT Start Time 1032   PT Stop Time 1115   PT Time Calculation (min) 43 min   Activity Tolerance Patient tolerated treatment well;No increased pain   Behavior During Therapy WFL for tasks assessed/performed      Past Medical History:  Diagnosis Date  . Arthritis    lumbar spondylosis  . Diabetes mellitus without complication (HCC)    told that he has diabetes but not treated   . Hypertension   . Stroke [facility])     History reviewed. No pertinent surgical history.  There were no vitals filed for this visit.      Subjective Assessment - 11/21/16 1036    Subjective Pt reports he is doing well on this date. No changes since last visit. He continues to complain of numbness and weakness in his legs.    Pertinent History Patient has a history of CVA 5 years ago, lumbar fusion L4-5 2 years ago, patient has a hx of diabetes,   Limitations Standing;Lifting   How long can you walk comfortably?   Currently in Pain? No/denies   Pain Onset More than a month ago          TREATMENT   Therapeutic Exercise Warm-up on NuStep L4 x 5 minutes during history; Leg Press at [facility]  85# x 20 (pt reports he could have performed at least 10 more), 95# x 15 pt continues to report he could have performed at least 10 more at this weight, will increase at next session; Hip abduction in standing at hip machine 40# 2 x 15 bilateral, monitored for fatigue; Forward BOSU lunges alternating LE x 15 each (round  side up), guarding performed for safety, infrequent minA+1 or finger touch for sability;;  Vitals checked during session due to profuse sweating, no complaints BP: 147/80, HR: 60, SaO2: 100% Sit to stand without UE support from regular height chair x 10, Airex on seat x 10; Side stepping up onto BOSU without UE support x 10 each direction; Cues provided throughout session for proper exercise form/technique;                         PT Education - 11/21/16 1037    Education provided Yes   Education Details form/technique with exercise, importance of HEP   Person(s) Educated Patient   Methods Explanation   Comprehension Verbalized understanding             PT Long Term Goals - 11/09/16 1153      PT LONG TERM GOAL #1   Title Patient will be independent with HEP to continue benefits of therapy after discharge.   Baseline Dependent for exercise performance and progression   Time 8   Period Weeks   Status New   Target Date 01/04/17     PT LONG TERM GOAL #2   Title Patient will improve TUG to under 9 sec to demonstrate significant improvement in balance and  decrease in fall risk   Baseline 13sec   Time 8   Period Weeks   Status New   Target Date 01/04/17     PT LONG TERM GOAL #3   Title Patient will improve 5XSTS to under 10sec to demonstrate significant improvement in LE strength and ability to stand up.    Baseline 16sec   Time 8   Period Weeks   Status New   Target Date 01/04/17     PT LONG TERM GOAL #4   Title Patient will improve 10mWT to >915m/s to demonstrate significant improvement in walking ability and greater safety with community ambulation   Baseline .4010m/s   Time 8   Period Weeks   Status New   Target Date 01/04/17               Plan - 11/21/16 1037    Clinical Impression Statement Increased resistance today with leg press as pt is not reaching fatigue although he does move slowly through repetitions. Denies any LE or back pain  today but reports continued frustration in LE weakness and numbness. Pt encouraged to complete HEP daily for maximal benefit. Pt encouraged to follow-up as scheduled.    Clinical Decision Making Moderate   Rehab Potential Good   Clinical Impairments Affecting Rehab Potential (-) Age (+) highly motivated   PT Frequency 2x / week   PT Duration 8 weeks   PT Treatment/Interventions Therapeutic activities;Therapeutic exercise;Balance training;Iontophoresis 4mg /ml Dexamethasone;Aquatic Therapy;Electrical Stimulation;Cryotherapy;Ultrasound;Neuromuscular re-education;Patient/family education;Gait training;Stair training;Manual techniques   PT Next Visit Plan Progress strengthening and balance exercises   Consulted and Agree with Plan of Care Patient      Patient will benefit from skilled therapeutic intervention in order to improve the following deficits and impairments:  Abnormal gait, Decreased endurance, Decreased range of motion, Decreased strength, Decreased balance, Pain, Decreased coordination, Decreased mobility, Difficulty walking  Visit Diagnosis: Difficulty in walking, not elsewhere classified  Muscle weakness (generalized)     Problem List Patient Active Problem List   Diagnosis Date Noted  . Spondylolisthesis, grade 2 12/26/2014  . Spondylisthesis 12/26/2014   Lynnea MaizesJason D Levonne Carreras PT, DPT   Sevin Langenbach 11/21/2016, 12:53 PM  [facility] [address] [place], [place], [address] Phone: [phone]   Fax:  [phone]  Name: Margarette AsalOscar Sabia MRN: [id number] Date of Birth: Nov 30, 1945

## 2016-11-24 ENCOUNTER — Ambulatory Visit: Payer: Medicare Other

## 2016-11-24 DIAGNOSIS — M6281 Muscle weakness (generalized): Secondary | ICD-10-CM

## 2016-11-24 DIAGNOSIS — R262 Difficulty in walking, not elsewhere classified: Secondary | ICD-10-CM

## 2016-11-24 NOTE — Therapy (Signed)
[facility] PHYS[facility]IC[facility] [address] [place], [place], [address] Phone: [phone]   Fax:  [phone]  Physical Therapy Treatment  Patient Details  Name: Isaiah Rogers MRN: [id number] Date of Birth: April 24, 1945 Referring Provider: Barbette ReichmannVishwanath Hande MD  Encounter Date: 11/24/2016      PT End of Session - 11/24/16 1046    Visit Number 5   Number of Visits 17   Date for PT Re-Evaluation 01/04/17   Authorization Type 5 / 10 G Codes   PT Start Time 1034   PT Stop Time 1115   PT Time Calculation (min) 41 min   Activity Tolerance Patient tolerated treatment well;No increased pain   Behavior During Therapy WFL for tasks assessed/performed      Past Medical History:  Diagnosis Date  . Arthritis    lumbar spondylosis  . Diabetes mellitus without complication (HCC)    told that he has diabetes but not treated   . Hypertension   . Stroke [facility](HCC)     History reviewed. No pertinent surgical history.  There were no vitals filed for this visit.      Subjective Assessment - 11/24/16 1042    Subjective Patient reports he's starting to feel a little bit better and stronger since the beginning of therapy. Patient hopes the therapy is helping    Pertinent History Patient has a history of CVA 5 years ago, lumbar fusion L4-5 2 years ago, patient has a hx of diabetes,   Limitations Standing;Lifting   How long can you walk comfortably? 15min   Currently in Pain? No/denies   Pain Onset More than a month ago         TREATMENT Therapeutic Exercise: [facility] 95# 2 x 15 Knee extension at [facility] -2 x15 35# Hip abduction in standing at hip machine - 2 x 20 B #40 Tandem ambulation down and back at side of the treadmill - x856ft x 10 Marches on blue side of BOSU ball - x 20 B Side stepping up and over BOSU ball - x 10 B  Side lunges onto Bosu ball - x10 B    Patient demonstrates increased LE fatigue at end of session         PT Education - 11/24/16 1045    Education provided Yes   Education Details form/technique with exercise;    Person(s) Educated Patient   Methods Explanation;Demonstration   Comprehension Verbalized understanding;Returned demonstration             PT Long Term Goals - 11/09/16 1153      PT LONG TERM GOAL #1   Title Patient will be independent with HEP to continue benefits of therapy after discharge.   Baseline Dependent for exercise performance and progression   Time 8   Period Weeks   Status New   Target Date 01/04/17     PT LONG TERM GOAL #2   Title Patient will improve TUG to under 9 sec to demonstrate significant improvement in balance and decrease in fall risk   Baseline 13sec   Time 8   Period Weeks   Status New   Target Date 01/04/17     PT LONG TERM GOAL #3   Title Patient will improve 5XSTS to under 10sec to demonstrate significant improvement in LE strength and ability to stand up.    Baseline 16sec   Time 8   Period Weeks   Status New   Target Date 01/04/17  PT LONG TERM GOAL #4   Title Patient will improve to >84m/s to demonstrate significant improvement in walking ability and greater safety with community ambulation   Baseline .81m/s   Time 8   Period Weeks   Status New   Target Date 01/04/17               Plan - 11/24/16 1050    Clinical Impression Statement Continued to advance exercise progression and patient demonstrates ability to perform exercises with greater amount of resistance and repetitions performed indicating improvement in LE strength and endurance. Patient continues to demonstrate decreased balance in standing requiring UE support to perform and patient will benefit from further skilled therapy to return to prior level of function.    Rehab Potential Good   Clinical Impairments Affecting Rehab Potential (-) Age (+) highly motivated   PT Frequency 2x / week   PT Duration 8 weeks   PT Treatment/Interventions  Therapeutic activities;Therapeutic exercise;Balance training;Iontophoresis 4mg /ml Dexamethasone;Aquatic Therapy;Electrical Stimulation;Cryotherapy;Ultrasound;Neuromuscular re-education;Patient/family education;Gait training;Stair training;Manual techniques   PT Next Visit Plan Progress strengthening and balance exercises   Consulted and Agree with Plan of Care Patient      Patient will benefit from skilled therapeutic intervention in order to improve the following deficits and impairments:  Abnormal gait, Decreased endurance, Decreased range of motion, Decreased strength, Decreased balance, Pain, Decreased coordination, Decreased mobility, Difficulty walking  Visit Diagnosis: Difficulty in walking, not elsewhere classified  Muscle weakness (generalized)     Problem List Patient Active Problem List   Diagnosis Date Noted  . Spondylolisthesis, grade 2 12/26/2014  . Spondylisthesis 12/26/2014    [name], PT DPT 11/24/2016, 11:30 AM  [facility] PHYSICAL AND SPORTS MEDICINE [address], [place], [address] Phone: [phone]   Fax:  [phone]  Name: Isaiah Rogers MRN: [id number] Date of Birth: Sep 16, 1945

## 2016-11-28 ENCOUNTER — Ambulatory Visit: Payer: Medicare Other

## 2016-11-28 DIAGNOSIS — R262 Difficulty in walking, not elsewhere classified: Secondary | ICD-10-CM

## 2016-11-28 DIAGNOSIS — M6281 Muscle weakness (generalized): Secondary | ICD-10-CM

## 2016-11-28 NOTE — Therapy (Signed)
[facility] [address] [place], [address] Phone: [phone]   Fax:  [phone]  Physical Therapy Treatment  Patient Details  Name: Isaiah Rogers MRN: [id number] Date of Birth: [birth year] Referring Provider: Barbette Reichmann MD  Encounter Date: 11/28/2016      PT End of Session - 11/28/16 1046    Visit Number 6   Number of Visits 17   Date for PT Re-Evaluation 01/04/17   Authorization Type 6 / 10 G Codes   PT Start Time 1032   PT Stop Time 1115   PT Time Calculation (min) 43 min   Activity Tolerance Patient tolerated treatment well;No increased pain   Behavior During Therapy WFL for tasks assessed/performed      Past Medical History:  Diagnosis Date  . Arthritis    lumbar spondylosis  . Diabetes mellitus without complication (HCC)    told that he has diabetes but not treated   . Hypertension   . Stroke [facility])     History reviewed. No pertinent surgical history.  There were no vitals filed for this visit.      Subjective Assessment - 11/28/16 1039    Subjective Patient reports he's starting to feel a little stronger but conitnues to have numbness and tingling on the bottom of his feet.    Pertinent History Patient has a history of CVA 5 years ago, lumbar fusion L4-5 2 years ago, patient has a hx of diabetes,   Limitations Standing;Lifting   How long can you walk comfortably?   Currently in Pain? No/denies   Pain Onset More than a month ago        TREATMENT Therapeutic Exercise: Leg Press at [facility] - 95# 2 x 15 Hip abduction in standing at hip machine - x 20 B #55 Hip extension in standing at hip machine - x 20 B #85  Tandem amb over aiex pad - x 5 down and back forward/backward x3  Sit to stand with raising 10# weight overhead - x 10; x10 with feet placed on airex pad  Lunges with holding 10# weight in hand - x 10 B    Patient demonstrates increased LE fatigue at end of  session        PT Education - 11/28/16 1045    Education provided Yes   Education Details form/technique with exercise   Person(s) Educated Patient   Methods Explanation;Demonstration   Comprehension Verbalized understanding;Returned demonstration             PT Long Term Goals - 11/09/16 1153      PT LONG TERM GOAL #1   Title Patient will be independent with HEP to continue benefits of therapy after discharge.   Baseline Dependent for exercise performance and progression   Time 8   Period Weeks   Status New   Target Date 01/04/17     PT LONG TERM GOAL #2   Title Patient will improve TUG to under 9 sec to demonstrate significant improvement in balance and decrease in fall risk   Baseline 13sec   Time 8   Period Weeks   Status New   Target Date 01/04/17     PT LONG TERM GOAL #3   Title Patient will improve 5XSTS to under 10sec to demonstrate significant improvement in LE strength and ability to stand up.    Baseline 16sec   Time 8   Period Weeks   Status New   Target Date  01/04/17     PT LONG TERM GOAL #4   Title Patient will improve to >41m/s to demonstrate significant improvement in walking ability and greater safety with community ambulation   Baseline .32m/s   Time 8   Period Weeks   Status New   Target Date 01/04/17               Plan - 11/28/16 1050    Clinical Impression Statement Continued to focus on improving LE strength and balanace to decrease fall risk and improve static/dynamic balance to decrease fall risk. Patient demonstrates increased postural sway with exercises requiring intermittent UE support to complete. Patient will benefit from further skilled therapy focused on improving balance and strength to return to prior level of functioning.    Rehab Potential Good   Clinical Impairments Affecting Rehab Potential (-) Age (+) highly motivated   PT Frequency 2x / week   PT Duration 8 weeks   PT Treatment/Interventions Therapeutic  activities;Therapeutic exercise;Balance training;Iontophoresis 4mg /ml Dexamethasone;Aquatic Therapy;Electrical Stimulation;Cryotherapy;Ultrasound;Neuromuscular re-education;Patient/family education;Gait training;Stair training;Manual techniques   PT Next Visit Plan Progress strengthening and balance exercises   Consulted and Agree with Plan of Care Patient      Patient will benefit from skilled therapeutic intervention in order to improve the following deficits and impairments:  Abnormal gait, Decreased endurance, Decreased range of motion, Decreased strength, Decreased balance, Pain, Decreased coordination, Decreased mobility, Difficulty walking  Visit Diagnosis: Difficulty in walking, not elsewhere classified  Muscle weakness (generalized)     Problem List Patient Active Problem List   Diagnosis Date Noted  . Spondylolisthesis, grade 2 12/26/2014  . Spondylisthesis 12/26/2014    [name], PT DPT 11/28/2016, 11:07 AM  [facility] PHYSICAL AND SPORTS MEDICINE [address] [place], [address] Phone: [phone]   Fax:  [phone]  Name: Isaiah Rogers MRN: [id number] Date of Birth: Apr 07, 1946

## 2016-12-01 ENCOUNTER — Ambulatory Visit: Payer: Medicare Other

## 2016-12-01 DIAGNOSIS — M6281 Muscle weakness (generalized): Secondary | ICD-10-CM

## 2016-12-01 DIAGNOSIS — R262 Difficulty in walking, not elsewhere classified: Secondary | ICD-10-CM | POA: Diagnosis not present

## 2016-12-01 NOTE — Therapy (Signed)
[facility] [address] [place], [address] Phone: [phone]   Fax:  [phone]  Physical Therapy Treatment  Patient Details  Name: Isaiah Rogers MRN: [id number] Date of Birth: May 07, 1945 Referring Provider: Barbette Reichmann MD  Encounter Date: 12/01/2016      PT End of Session - 12/01/16 1041    Visit Number 7   Number of Visits 17   Date for PT Re-Evaluation 01/04/17   Authorization Type 7 / 10 G Codes   PT Start Time 09/12/1031   PT Stop Time 1115   PT Time Calculation (min) 42 min   Activity Tolerance Patient tolerated treatment well;No increased pain   Behavior During Therapy WFL for tasks assessed/performed      Past Medical History:  Diagnosis Date  . Arthritis    lumbar spondylosis  . Diabetes mellitus without complication (HCC)    told that he has diabetes but not treated   . Hypertension   . Stroke [facility])     History reviewed. No pertinent surgical history.  There were no vitals filed for this visit.      Subjective Assessment - 12/01/16 1038    Subjective Patient reports his legs are a little tired today. Patient continues to report increased numbness and tingling on the bottom of his feet.    Pertinent History Patient has a history of CVA 5 years ago, lumbar fusion L4-5 2 years ago, patient has a hx of diabetes,   Limitations Standing;Lifting   How long can you walk comfortably?   Currently in Pain? No/denies   Pain Onset More than a month ago       TREATMENT Therapeutic Exercise: Leg Press at [facility] - 95# 3 x 17 Single leg stance on airex pad with intermittent UE support - 3 x 30sec  Standing squats with holding 3kg ball on in front - x 20  Standing Running man on airex pad - x 20 B  Standing [facility] hip/lumbar extension - 2 x 20 required frequent verbal and tactile cueing to perform Lumbar extension in standing with black tubing - x20 Tandem stance on airex pad - 2 x 45  sec B    Patient demonstrates increased LE fatigue at end of session       PT Education - 12/01/16 1041    Education provided Yes   Education Details form/technique with exercise   Person(s) Educated Patient   Methods Explanation;Demonstration   Comprehension Returned demonstration;Verbalized understanding             PT Long Term Goals - 11/09/16 1153      PT LONG TERM GOAL #1   Title Patient will be independent with HEP to continue benefits of therapy after discharge.   Baseline Dependent for exercise performance and progression   Time 8   Period Weeks   Status New   Target Date 01/04/17     PT LONG TERM GOAL #2   Title Patient will improve TUG to under 9 sec to demonstrate significant improvement in balance and decrease in fall risk   Baseline 13sec   Time 8   Period Weeks   Status New   Target Date 01/04/17     PT LONG TERM GOAL #3   Title Patient will improve 5XSTS to under 10sec to demonstrate significant improvement in LE strength and ability to stand up.    Baseline 16sec   Time 8   Period Weeks   Status  New   Target Date 01/04/17     PT LONG TERM GOAL #4   Title Patient will improve to >54m/s to demonstrate significant improvement in walking ability and greater safety with community ambulation   Baseline .76m/s   Time 8   Period Weeks   Status New   Target Date 01/04/17               Plan - 12/01/16 1057    Clinical Impression Statement Advanced patient's exercises to improve hip/lumbar stabilization with exercises. Patient demonstrates improvement with leg press exercise with ability to perform greater of repetitions compared to previous treatment sessions. Patient continues to have poor motor control and poor balance. Patient will benefit from further skilled therapy to return to prior level of function.    Rehab Potential Good   Clinical Impairments Affecting Rehab Potential (-) Age (+) highly motivated   PT Frequency 2x / week    PT Duration 8 weeks   PT Treatment/Interventions Therapeutic activities;Therapeutic exercise;Balance training;Iontophoresis 4mg /ml Dexamethasone;Aquatic Therapy;Electrical Stimulation;Cryotherapy;Ultrasound;Neuromuscular re-education;Patient/family education;Gait training;Stair training;Manual techniques   PT Next Visit Plan Progress strengthening and balance exercises   Consulted and Agree with Plan of Care Patient      Patient will benefit from skilled therapeutic intervention in order to improve the following deficits and impairments:  Abnormal gait, Decreased endurance, Decreased range of motion, Decreased strength, Decreased balance, Pain, Decreased coordination, Decreased mobility, Difficulty walking  Visit Diagnosis: Difficulty in walking, not elsewhere classified  Muscle weakness (generalized)     Problem List Patient Active Problem List   Diagnosis Date Noted  . Spondylolisthesis, grade 2 12/26/2014  . Spondylisthesis 12/26/2014    [name], PT DPT 12/01/2016, 11:20 AM  [place] PHYSICAL AND SPORTS MEDICINE [address], [place], [address] Phone: [phone]   Fax:  [phone]  Name: Isaiah Rogers MRN: [id number] Date of Birth: 07/07/1945

## 2016-12-05 ENCOUNTER — Ambulatory Visit: Payer: Medicare Other

## 2016-12-05 DIAGNOSIS — R262 Difficulty in walking, not elsewhere classified: Secondary | ICD-10-CM

## 2016-12-05 DIAGNOSIS — M6281 Muscle weakness (generalized): Secondary | ICD-10-CM

## 2016-12-05 NOTE — Therapy (Signed)
[facility] PHYSICAL [facility] SPORTS MEDICINE [address] [place], [address] Phone: [phone]   Fax:  [phone]  Physical Therapy Treatment  Patient Details  Name: Isaiah Rogers MRN: [id number] Date of Birth: 12-18-1945 Referring Provider: Barbette Reichmann MD  Encounter Date: 12/05/2016      PT End of Session - 12/05/16 1037    Visit Number 8   Number of Visits 17   Date for PT Re-Evaluation 01/04/17   Authorization Type 8 / 10 G Codes   PT Start Time Sep 02, 1031   PT Stop Time 1115   PT Time Calculation (min) 42 min   Activity Tolerance Patient tolerated treatment well;No increased pain   Behavior During Therapy WFL for tasks assessed/performed      Past Medical History:  Diagnosis Date  . Arthritis    lumbar spondylosis  . Diabetes mellitus without complication (HCC)    told that he has diabetes but not treated   . Hypertension   . Stroke [facility])     No past surgical history on file.  There were no vitals filed for this visit.      Subjective Assessment - 12/05/16 1033    Subjective Patient reports increased back pain today and numbness/tingling in the bottom of his feet. Patient states he gets the most LBP with reaching and lifting items from the floor.    Pertinent History Patient has a history of CVA 5 years ago, lumbar fusion L4-5 2 years ago, patient has a hx of diabetes,   Limitations Standing;Lifting   How long can you walk comfortably?   Currently in Pain? No/denies   Pain Onset More than a month ago       TREATMENT Therapeutic Exercise: Leg Press at [facility] - 95# 3 x 17 SLR in supine - x 20 B  Bridges in hooklying - x 20  Prone press ups - x10 Prone hip extension - x 10  Golfers lift (education with performance) - x 5 with focus on foot positioning for balance Single leg DL with UE support - x 10 with 5# Squat lifts with 15# box - x20    Patient demonstrates increased LE fatigue at end of session;  educated on performing mat exercises in the morning before arising.        PT Education - 12/05/16 1036    Education provided Yes   Education Details form/technique with exercise   Person(s) Educated Patient   Methods Explanation;Demonstration   Comprehension Verbalized understanding;Returned demonstration             PT Long Term Goals - 11/09/16 1153      PT LONG TERM GOAL #1   Title Patient will be independent with HEP to continue benefits of therapy after discharge.   Baseline Dependent for exercise performance and progression   Time 8   Period Weeks   Status New   Target Date 01/04/17     PT LONG TERM GOAL #2   Title Patient will improve TUG to under 9 sec to demonstrate significant improvement in balance and decrease in fall risk   Baseline 13sec   Time 8   Period Weeks   Status New   Target Date 01/04/17     PT LONG TERM GOAL #3   Title Patient will improve 5XSTS to under 10sec to demonstrate significant improvement in LE strength and ability to stand up.    Baseline 16sec   Time 8   Period Weeks  Status New   Target Date 01/04/17     PT LONG TERM GOAL #4   Title Patient will improve to >54m/s to demonstrate significant improvement in walking ability and greater safety with community ambulation   Baseline .95m/s   Time 8   Period Weeks   Status New   Target Date 01/04/17               Plan - 12/05/16 1039    Clinical Impression Statement Focused on educating on performing exercises in his bed before arrising in the AM. Patient demonstrates decreased numbness/tingling with performance of leg press indicating improvement of pain with pelvic position. Patient will benefit from further skilled therapy to return to prior level of function.     Rehab Potential Good   Clinical Impairments Affecting Rehab Potential (-) Age (+) highly motivated   PT Frequency 2x / week   PT Duration 8 weeks   PT Treatment/Interventions Therapeutic  activities;Therapeutic exercise;Balance training;Iontophoresis 4mg /ml Dexamethasone;Aquatic Therapy;Electrical Stimulation;Cryotherapy;Ultrasound;Neuromuscular re-education;Patient/family education;Gait training;Stair training;Manual techniques   PT Next Visit Plan Progress strengthening and balance exercises   Consulted and Agree with Plan of Care Patient      Patient will benefit from skilled therapeutic intervention in order to improve the following deficits and impairments:  Abnormal gait, Decreased endurance, Decreased range of motion, Decreased strength, Decreased balance, Pain, Decreased coordination, Decreased mobility, Difficulty walking  Visit Diagnosis: Difficulty in walking, not elsewhere classified  Muscle weakness (generalized)     Problem List Patient Active Problem List   Diagnosis Date Noted  . Spondylolisthesis, grade 2 12/26/2014  . Spondylisthesis 12/26/2014    [name], PT DPT 12/05/2016, 11:15 AM  [facility] [facility] PHYSICAL AND SPORTS MEDICINE [address], [place], [address] Phone: [phone]   Fax:  [phone]  Name: Isaiah Rogers MRN: [id number] Date of Birth: 1946/03/27

## 2016-12-08 ENCOUNTER — Ambulatory Visit: Payer: Medicare Other

## 2016-12-08 DIAGNOSIS — M6281 Muscle weakness (generalized): Secondary | ICD-10-CM

## 2016-12-08 DIAGNOSIS — R262 Difficulty in walking, not elsewhere classified: Secondary | ICD-10-CM

## 2016-12-08 NOTE — Therapy (Signed)
[facility] [facility] PHYSICAL AND SPORTS MEDICINE [address] [place], [address] Phone: [phone]   Fax:  [phone]  Physical Therapy Treatment  Patient Details  Name: Isaiah Rogers MRN: [id number] Date of Birth: 12/11/1945 Referring Provider: Barbette Reichmann MD  Encounter Date: 12/08/2016      PT End of Session - 12/08/16 1058    Visit Number 9   Number of Visits 17   Date for PT Re-Evaluation 01/04/17   Authorization Type 9 / 10 G Codes   PT Start Time 1035   PT Stop Time 1115   PT Time Calculation (min) 40 min   Activity Tolerance Patient tolerated treatment well;No increased pain   Behavior During Therapy WFL for tasks assessed/performed      Past Medical History:  Diagnosis Date  . Arthritis    lumbar spondylosis  . Diabetes mellitus without complication (HCC)    told that he has diabetes but not treated   . Hypertension   . Stroke [facility])     History reviewed. No pertinent surgical history.  There were no vitals filed for this visit.      Subjective Assessment - 12/08/16 1049    Subjective Patient reports the exercises he performed in the morning before getting out of bed have slightly helped with his weakness when standing up.    Pertinent History Patient has a history of CVA 5 years ago, lumbar fusion L4-5 2 years ago, patient has a hx of diabetes,   Limitations Standing;Lifting   How long can you walk comfortably?   Currently in Pain? No/denies   Pain Onset More than a month ago        TREATMENT: Therapeutic Exercise: Step ups onto Bosu - x 20 B Side stepping onto Bosu - x 20 up and over Standing feet together on bosu - x 3 min with intermittent UE support Leg Press at [facility] - 2 x 20 95# Tandem stance widened weight shifts ant/post - x 20  Standing single leg Deadlift with tapping item on ground - x 20  Patient demonstrates increased LE fatigue at end of session.         PT Education - 12/08/16  1052    Education provided Yes   Education Details Educated on form/technique with exercise   Person(s) Educated Patient   Methods Explanation;Demonstration   Comprehension Verbalized understanding;Returned demonstration             PT Long Term Goals - 11/09/16 1153      PT LONG TERM GOAL #1   Title Patient will be independent with HEP to continue benefits of therapy after discharge.   Baseline Dependent for exercise performance and progression   Time 8   Period Weeks   Status New   Target Date 01/04/17     PT LONG TERM GOAL #2   Title Patient will improve TUG to under 9 sec to demonstrate significant improvement in balance and decrease in fall risk   Baseline 13sec   Time 8   Period Weeks   Status New   Target Date 01/04/17     PT LONG TERM GOAL #3   Title Patient will improve 5XSTS to under 10sec to demonstrate significant improvement in LE strength and ability to stand up.    Baseline 16sec   Time 8   Period Weeks   Status New   Target Date 01/04/17     PT LONG TERM GOAL #4   Title Patient  will improve 10mWT to >5023m/s to demonstrate significant improvement in walking ability and greater safety with community ambulation   Baseline .6739m/s   Time 8   Period Weeks   Status New   Target Date 01/04/17               Plan - 12/08/16 1101    Clinical Impression Statement Focussed on performing exercises to challenge patient's balance in standing. Patient demonstrates decreased numbness and tingling with exercises. Patient demonstrates increased diffculty with balance on bosu ball. Patient will benefit form further skiled therapy to return to prior level of function.     Rehab Potential Good   Clinical Impairments Affecting Rehab Potential (-) Age (+) highly motivated   PT Frequency 2x / week   PT Duration 8 weeks   PT Treatment/Interventions Therapeutic activities;Therapeutic exercise;Balance training;Iontophoresis 4mg /ml Dexamethasone;Aquatic  Therapy;Electrical Stimulation;Cryotherapy;Ultrasound;Neuromuscular re-education;Patient/family education;Gait training;Stair training;Manual techniques   PT Next Visit Plan Progress strengthening and balance exercises   Consulted and Agree with Plan of Care Patient      Patient will benefit from skilled therapeutic intervention in order to improve the following deficits and impairments:  Abnormal gait, Decreased endurance, Decreased range of motion, Decreased strength, Decreased balance, Pain, Decreased coordination, Decreased mobility, Difficulty walking  Visit Diagnosis: Difficulty in walking, not elsewhere classified  Muscle weakness (generalized)     Problem List Patient Active Problem List   Diagnosis Date Noted  . Spondylolisthesis, grade 2 12/26/2014  . Spondylisthesis 12/26/2014    Myrene GalasWesley Aniqa Hare, PT DPT 12/08/2016, 11:27 AM  [facility] [address] [place], [place], [address] Phone: [phone]   Fax:  [phone]  Name: Isaiah Rogers MRN: [id number] Date of Birth: 1945/11/07

## 2016-12-13 ENCOUNTER — Ambulatory Visit: Payer: Medicare Other | Attending: Internal Medicine

## 2016-12-13 DIAGNOSIS — R262 Difficulty in walking, not elsewhere classified: Secondary | ICD-10-CM | POA: Insufficient documentation

## 2016-12-13 DIAGNOSIS — M6281 Muscle weakness (generalized): Secondary | ICD-10-CM | POA: Insufficient documentation

## 2016-12-15 ENCOUNTER — Ambulatory Visit: Payer: Medicare Other

## 2016-12-20 ENCOUNTER — Ambulatory Visit: Payer: Medicare Other

## 2016-12-20 DIAGNOSIS — M6281 Muscle weakness (generalized): Secondary | ICD-10-CM | POA: Diagnosis present

## 2016-12-20 DIAGNOSIS — R262 Difficulty in walking, not elsewhere classified: Secondary | ICD-10-CM | POA: Diagnosis not present

## 2016-12-20 NOTE — Therapy (Signed)
[place] [facility] PHYSICAL AND SPORTS MEDICINE [address] [place], [place], [address] Phone: [phone]   Fax:  [phone]  Physical Therapy Treatment  Patient Details  Name: Isaiah Rogers MRN: [id number] Date of Birth: 30-Apr-1945 Referring Provider: Barbette ReichmannVishwanath Hande MD  Encounter Date: 12/20/2016      PT End of Session - 12/20/16 0937    Visit Number 10   Number of Visits 17   Date for PT Re-Evaluation 01/04/17   Authorization Type 10 / 10 G Codes   PT Start Time 0900   PT Stop Time 0945   PT Time Calculation (min) 45 min   Activity Tolerance Patient tolerated treatment well;No increased pain   Behavior During Therapy WFL for tasks assessed/performed      Past Medical History:  Diagnosis Date  . Arthritis    lumbar spondylosis  . Diabetes mellitus without complication (HCC)    told that he has diabetes but not treated   . Hypertension   . Stroke [facility](HCC)     History reviewed. No pertinent surgical history.  There were no vitals filed for this visit.      Subjective Assessment - 12/20/16 0906    Subjective Patient reports he's continued to perform exercises at home. Patient states he continues to feel the numbness and tingling in his feet but reports he feels a little stronger.    Pertinent History Patient has a history of CVA 5 years ago, lumbar fusion L4-5 2 years ago, patient has a hx of diabetes,   Limitations Standing;Lifting   How long can you walk comfortably? 15min   Currently in Pain? No/denies   Pain Onset More than a month ago        TREATMENT: Therapeutic Exercise: Step ups onto 2nd step - 2 x 10 B Heel lifts in standing - Performed unilaterally 2 x 20  Sit to stands without UE support - 2 x 10  Squats with UE support - 2 x 20  Hip abduction in standing - 2 x 20   Sit to stand - 13sec 10mWT - 1.3769m/s TUG - 12sec  Manual Therapy: Grade I-II mobilizations to L1-5 centrally and unilateraly (performed B) to  decrease increased pain and spasms in the LB. STM performed to multifdi B to decrease spasms and pain with patient positioned in prone.   Patient demonstrates increased LE fatigue at end of session.        PT Education - 12/20/16 0937    Education provided Yes   Education Details Form/technique; POC   Person(s) Educated Patient   Methods Demonstration;Explanation   Comprehension Verbalized understanding;Returned demonstration             PT Long Term Goals - 12/20/16 0911      PT LONG TERM GOAL #1   Title Patient will be independent with HEP to continue benefits of therapy after discharge.   Baseline Dependent for exercise performance and progression; moderate cueing for form/technique    Time 8   Period Weeks   Status On-going     PT LONG TERM GOAL #2   Title Patient will improve TUG to under 9 sec to demonstrate significant improvement in balance and decrease in fall risk   Baseline 13sec; 12/20/16: 11sec    Time 8   Period Weeks   Status On-going     PT LONG TERM GOAL #3   Title Patient will improve 5XSTS to under 10sec to demonstrate significant improvement in LE strength and ability  to stand up.    Baseline 16sec; 29-Dec-2016: 13sec sit to stand    Time 8   Period Weeks   Status On-going     PT LONG TERM GOAL #4   Title Patient will improve to >54m/s to demonstrate significant improvement in walking ability and greater safety with community ambulation   Baseline .37m/s; Dec 29, 2016: 1.11 m/s    Time 8   Period Weeks   Status Achieved               Plan - 12/29/2016 1210    Clinical Impression Statement Patient demonstrates improvement with TUG, 5xSTS, and indicating functional improvement in LE strength, balance, and decreased fall risk. Patient also demonstrates improvement in low back pain reporting overall decrease in pain since the beginning of therapy. Patient demonstrates decreased pain after performing manual therapy today indicating improved  motor control and tissue elasticity. Patient will benefit from further skilled therapy to return to prior level of function.    Rehab Potential Good   Clinical Impairments Affecting Rehab Potential (-) Age (+) highly motivated   PT Frequency 2x / week   PT Duration 8 weeks   PT Treatment/Interventions Therapeutic activities;Therapeutic exercise;Balance training;Iontophoresis /ml Dexamethasone;Aquatic Therapy;Electrical Stimulation;Cryotherapy;Ultrasound;Neuromuscular re-education;Patient/family education;Gait training;Stair training;Manual techniques   PT Next Visit Plan Progress strengthening and balance exercises   Consulted and Agree with Plan of Care Patient      Patient will benefit from skilled therapeutic intervention in order to improve the following deficits and impairments:  Abnormal gait, Decreased endurance, Decreased range of motion, Decreased strength, Decreased balance, Pain, Decreased coordination, Decreased mobility, Difficulty walking  Visit Diagnosis: Difficulty in walking, not elsewhere classified  Muscle weakness (generalized)       G-Codes - 12/29/2016 1215    Functional Assessment Tool Used (Outpatient Only) Clinical Judgement, TUG, 5XSTS,   Functional Limitation Mobility: Walking and moving around   Mobility: Walking and Moving Around Current Status ([id number]) At least 1 percent but less than 20 percent impaired, limited or restricted   Mobility: Walking and Moving Around Goal Status [phone]) At least 1 percent but less than 20 percent impaired, limited or restricted      Problem List Patient Active Problem List   Diagnosis Date Noted  . Spondylolisthesis, grade 2 12/26/2014  . Spondylisthesis 12/26/2014    [name], PT DPT Dec 29, 2016, 12:15 PM  [place] [facility] PHYSICAL AND SPORTS MEDICINE [address], [place], [address] Phone: [phone]   Fax:  [phone]  Name: Isaiah Rogers MRN:  [id number] Date of Birth: [birth year]

## 2016-12-27 ENCOUNTER — Ambulatory Visit: Payer: Medicare Other

## 2016-12-27 DIAGNOSIS — R262 Difficulty in walking, not elsewhere classified: Secondary | ICD-10-CM | POA: Diagnosis not present

## 2016-12-27 DIAGNOSIS — M6281 Muscle weakness (generalized): Secondary | ICD-10-CM

## 2016-12-27 NOTE — Therapy (Signed)
[place] [facility] PHYS[facility]ORTS MEDICINE [address] [place], [address] Phone: [phone]   Fax:  [phone]  Physical Therapy Treatment  Patient Details  Name: Isaiah Rogers MRN: [id number] Date of Birth: 1945/05/21 Referring Provider: Barbette Reichmann MD  Encounter Date: 12/27/2016      PT End of Session - 12/27/16 1057    Visit Number 11   Number of Visits 17   Date for PT Re-Evaluation 01/04/17   Authorization Type 1 / 10 G Codes   PT Start Time 1030   PT Stop Time 1115   PT Time Calculation (min) 45 min   Activity Tolerance Patient tolerated treatment well;No increased pain   Behavior During Therapy WFL for tasks assessed/performed      Past Medical History:  Diagnosis Date  . Arthritis    lumbar spondylosis  . Diabetes mellitus without complication (HCC)    told that he has diabetes but not treated   . Hypertension   . Stroke [facility])     History reviewed. No pertinent surgical history.  There were no vitals filed for this visit.      Subjective Assessment - 12/27/16 1034    Subjective Patient reports the symptoms are a little bit better but are overall the same. Patient reports he continues to have numbness and tingling in his feet but reports it gets worse throughout the day with working.     Pertinent History Patient has a history of CVA 5 years ago, lumbar fusion L4-5 2 years ago, patient has a hx of diabetes,   Limitations Standing;Lifting   How long can you walk comfortably?   Currently in Pain? No/denies   Pain Onset More than a month ago          TREATMENT: Therapeutic Exercise: Squats in standing -- 2 x 20  Standing hip abduction -- x 15 B Self mobilization to the plantar surface of the feet -- 1.5 min B Ball roll outs with golf ball -- 1.5 min B Heel lifts in standing - Performed Bilaterally 2 x 20     Manual Therapy: Grade I-II mobilizations to L1-5 centrally and unilateraly  (performed B) to decrease increased pain and spasms in the LB. STM performed to multifdi B to decrease spasms and pain with patient positioned in prone. STM to plantar surface of patient's feet to decrease numbness/tingling  Patient demonstrates decreased numbness tingling at bottom of feet at end of session         PT Education - 12/27/16 1056    Education provided Yes   Education Details form/technique with exercises   Person(s) Educated Patient   Methods Demonstration;Explanation   Comprehension Verbalized understanding;Returned demonstration             PT Long Term Goals - 12/20/16 0911      PT LONG TERM GOAL #1   Title Patient will be independent with HEP to continue benefits of therapy after discharge.   Baseline Dependent for exercise performance and progression; moderate cueing for form/technique    Time 8   Period Weeks   Status On-going     PT LONG TERM GOAL #2   Title Patient will improve TUG to under 9 sec to demonstrate significant improvement in balance and decrease in fall risk   Baseline 13sec; 12/20/16: 11sec    Time 8   Period Weeks   Status On-going     PT LONG TERM GOAL #3   Title Patient will  improve 5XSTS to under 10sec to demonstrate significant improvement in LE strength and ability to stand up.    Baseline 16sec; 12/20/16: 13sec sit to stand    Time 8   Period Weeks   Status On-going     PT LONG TERM GOAL #4   Title Patient will improve to >9m/s to demonstrate significant improvement in walking ability and greater safety with community ambulation   Baseline .82m/s; 12/20/16: 1.11 m/s    Time 8   Period Weeks   Status Achieved               Plan - 12/27/16 1058    Clinical Impression Statement Patient demonstrates improvement in the numbness and tingling symptoms are performing self soft tissue mobilization to the area. Patient demonstrates decreased numbness and tingling on the bottom of his feet at the end of therapy  compared to the start of therapy. Patient demonstrates he continues to have increased symptoms and focused today's treatment on educating patient on proper positioning with exercises. Patient will benefit from further skilled therapy focused on improving lumbar strength, and LE strength to decrease symptoms to return to prior level of function.    Rehab Potential Good   Clinical Impairments Affecting Rehab Potential (-) Age (+) highly motivated   PT Frequency 2x / week   PT Duration 8 weeks   PT Treatment/Interventions Therapeutic activities;Therapeutic exercise;Balance training;Iontophoresis /ml Dexamethasone;Aquatic Therapy;Electrical Stimulation;Cryotherapy;Ultrasound;Neuromuscular re-education;Patient/family education;Gait training;Stair training;Manual techniques   PT Next Visit Plan Progress strengthening and balance exercises   Consulted and Agree with Plan of Care Patient      Patient will benefit from skilled therapeutic intervention in order to improve the following deficits and impairments:  Abnormal gait, Decreased endurance, Decreased range of motion, Decreased strength, Decreased balance, Pain, Decreased coordination, Decreased mobility, Difficulty walking  Visit Diagnosis: Difficulty in walking, not elsewhere classified  Muscle weakness (generalized)     Problem List Patient Active Problem List   Diagnosis Date Noted  . Spondylolisthesis, grade 2 12/26/2014  . Spondylisthesis 12/26/2014    [name], PT DPT 12/27/2016, 11:13 AM  [place] [facility] PHYSICAL AND SPORTS MEDICINE [address] [place], [address] Phone: [phone]   Fax:  [phone]  Name: Isaiah Rogers MRN: [id number] Date of Birth: Jun 03, 1945

## 2017-01-03 ENCOUNTER — Ambulatory Visit: Payer: Medicare Other

## 2017-01-03 DIAGNOSIS — R262 Difficulty in walking, not elsewhere classified: Secondary | ICD-10-CM | POA: Diagnosis not present

## 2017-01-03 DIAGNOSIS — M6281 Muscle weakness (generalized): Secondary | ICD-10-CM

## 2017-01-03 NOTE — Therapy (Signed)
[place] [facility] PHYSICAL [facility] SPORTS MEDICINE [address] [place], [address] Phone: [phone]   Fax:  [phone]  Physical Therapy Treatment  Patient Details  Name: Isaiah Rogers MRN: [id number] Date of Birth: 1945/07/31 Referring Provider: Barbette Reichmann MD  Encounter Date: 01/03/2017      PT End of Session - 01/03/17 1100    Visit Number 12   Number of Visits 17   Date for PT Re-Evaluation 01/04/17   Authorization Type 2 / 10 G Codes   PT Start Time 1045   PT Stop Time 1130   PT Time Calculation (min) 45 min   Activity Tolerance Patient tolerated treatment well;No increased pain   Behavior During Therapy WFL for tasks assessed/performed      Past Medical History:  Diagnosis Date  . Arthritis    lumbar spondylosis  . Diabetes mellitus without complication (HCC)    told that he has diabetes but not treated   . Hypertension   . Stroke [facility])     History reviewed. No pertinent surgical history.  There were no vitals filed for this visit.      Subjective Assessment - 01/03/17 1057    Subjective Patient reports the symptoms have improved a small amount since starting to perform the massage on his feet.    Pertinent History Patient has a history of CVA 5 years ago, lumbar fusion L4-5 2 years ago, patient has a hx of diabetes,   Limitations Standing;Lifting   How long can you walk comfortably?   Currently in Pain? No/denies   Pain Onset More than a month ago        TREATMENT: Therapeutic Exercise: Heel lifts in standing - Performed unilaterally  2 x 10  Single leg stance without UE support - 2 x 1 min B Leg Press at OMEGA - 2 x 15 85#  Side stepping across balance stones in standing - x 10  Serpentine pattern around balance stones - x 3 balance stones - x10    Manual Therapy: Grade I-II mobilizations to L1-5 centrally and unilaterally (performed B) to decrease increased pain and spasms in the LB. STM  performed to multifdi B to decrease spasms and pain with patient positioned in prone.    Patient demonstrates decreased numbness tingling at bottom of feet at end of manual therapy        PT Education - 01/03/17 1100    Education provided Yes   Education Details form/technique with exercises   Person(s) Educated Patient   Methods Explanation;Demonstration   Comprehension Verbalized understanding;Returned demonstration             PT Long Term Goals - 12/20/16 0911      PT LONG TERM GOAL #1   Title Patient will be independent with HEP to continue benefits of therapy after discharge.   Baseline Dependent for exercise performance and progression; moderate cueing for form/technique    Time 8   Period Weeks   Status On-going     PT LONG TERM GOAL #2   Title Patient will improve TUG to under 9 sec to demonstrate significant improvement in balance and decrease in fall risk   Baseline 13sec; 12/20/16: 11sec    Time 8   Period Weeks   Status On-going     PT LONG TERM GOAL #3   Title Patient will improve 5XSTS to under 10sec to demonstrate significant improvement in LE strength and ability to stand up.    Baseline  16sec; 12/20/16: 13sec sit to stand    Time 8   Period Weeks   Status On-going     PT LONG TERM GOAL #4   Title Patient will improve to >44m/s to demonstrate significant improvement in walking ability and greater safety with community ambulation   Baseline .93m/s; 12/20/16: 1.11 m/s    Time 8   Period Weeks   Status Achieved               Plan - 01/03/17 1103    Clinical Impression Statement Patient demonstrates slight improvement in symptoms after performing manual therapy to the area. However patient reports numbness and tingling returned when standing and performing exercises. Conitnued to improve strength in standing and decreasing the numbness and tingling in the lower legs / feet. Patient will benefit from further skilled therapy to return to  prior level of function.    Rehab Potential Good   Clinical Impairments Affecting Rehab Potential (-) Age (+) highly motivated   PT Frequency 2x / week   PT Duration 8 weeks   PT Treatment/Interventions Therapeutic activities;Therapeutic exercise;Balance training;Iontophoresis /ml Dexamethasone;Aquatic Therapy;Electrical Stimulation;Cryotherapy;Ultrasound;Neuromuscular re-education;Patient/family education;Gait training;Stair training;Manual techniques   PT Next Visit Plan Progress strengthening and balance exercises   Consulted and Agree with Plan of Care Patient      Patient will benefit from skilled therapeutic intervention in order to improve the following deficits and impairments:  Abnormal gait, Decreased endurance, Decreased range of motion, Decreased strength, Decreased balance, Pain, Decreased coordination, Decreased mobility, Difficulty walking  Visit Diagnosis: Difficulty in walking, not elsewhere classified  Muscle weakness (generalized)     Problem List Patient Active Problem List   Diagnosis Date Noted  . Spondylolisthesis, grade 2 12/26/2014  . Spondylisthesis 12/26/2014    [name], PT DPT 01/03/2017, 11:30 AM  [place] [facility] PHYSICAL AND SPORTS MEDICINE [address] [place], [address] Phone: [phone]   Fax:  [phone]  Name: Kjuan Seipp MRN: [id number] Date of Birth: 11-05-1945

## 2017-01-05 ENCOUNTER — Ambulatory Visit: Payer: Medicare Other

## 2017-01-05 DIAGNOSIS — R262 Difficulty in walking, not elsewhere classified: Secondary | ICD-10-CM

## 2017-01-05 DIAGNOSIS — M6281 Muscle weakness (generalized): Secondary | ICD-10-CM

## 2017-01-05 NOTE — Therapy (Signed)
[place] [facility] PHYSICAL AND SPORTS MEDICINE [address] [place], [address] Phone: [phone]   Fax:  [phone]  Physical Therapy Treatment  Patient Details  Name: Isaiah Rogers MRN: [id number] Date of Birth: 1945/10/06 Referring Provider: Barbette Reichmann MD  Encounter Date: 01/05/2017      PT End of Session - 01/05/17 1011    Visit Number 13   Number of Visits 25   Date for PT Re-Evaluation 02/02/17   Authorization Type 3 / 10 G Codes   PT Start Time 0945   PT Stop Time 1030   PT Time Calculation (min) 45 min   Activity Tolerance Patient tolerated treatment well;No increased pain   Behavior During Therapy WFL for tasks assessed/performed      Past Medical History:  Diagnosis Date  . Arthritis    lumbar spondylosis  . Diabetes mellitus without complication (HCC)    told that he has diabetes but not treated   . Hypertension   . Stroke [facility])     History reviewed. No pertinent surgical history.  There were no vitals filed for this visit.      Subjective Assessment - 01/05/17 0955    Subjective Patient reports he's overall improved since the beginning of therapy and states the massage with the golf ball to his feet has helped quite a bit since the start of therapy.    Pertinent History Patient has a history of CVA 5 years ago, lumbar fusion L4-5 2 years ago, patient has a hx of diabetes,   Limitations Standing;Lifting   How long can you walk comfortably?   Currently in Pain? No/denies   Pain Onset More than a month ago        TREATMENT: Therapeutic Exercise: Heel lifts in standing off of step with UE support -Performed unilaterally  2 x 10  Single leg stance without UE support - 2 x 1 min B Side stepping up and over airex pad -- x 20 B  Single leg stance on airex pad -- 1 min x 2  Sit to stand -- 4 x 5 with focus on speed of performance. Turning and walking and sitting -- 63m -- x 5   Manual  Therapy: STM performed to bottom of patient's feet to decrease increased spasms and numbness with patient positioned in long sitting.   Patient demonstrates decreased numbness tingling at bottom of feet at end of manual therapy        PT Education - 01/05/17 1010    Education provided Yes   Education Details form/technique with exercise   Person(s) Educated Patient   Methods Explanation;Demonstration   Comprehension Verbalized understanding;Returned demonstration             PT Long Term Goals - 01/05/17 1018      PT LONG TERM GOAL #1   Title Patient will be independent with HEP to continue benefits of therapy after discharge.   Baseline Dependent for exercise performance and progression; moderate cueing for form/technique    Time 8   Period Weeks   Status On-going     PT LONG TERM GOAL #2   Title Patient will improve TUG to under 9 sec to demonstrate significant improvement in balance and decrease in fall risk   Baseline 13sec; 12/20/16: 11sec; 01/05/17: 10 sec   Time 8   Period Weeks   Status On-going     PT LONG TERM GOAL #3   Title Patient will improve 5XSTS to under  10sec to demonstrate significant improvement in LE strength and ability to stand up.    Baseline 16sec; 12/20/16: 13sec sit to stand; 01/05/17: 12sec   Time 8   Period Weeks   Status On-going     PT LONG TERM GOAL #4   Title Patient will improve to >64m/s to demonstrate significant improvement in walking ability and greater safety with community ambulation   Baseline .89m/s; 12/20/16: 1.11 m/s    Time 8   Period Weeks   Status Achieved               Plan - 01/05/17 1022    Clinical Impression Statement Patient demonstrates improvement with TUG, 5XSTS and single leg stance balance indicating functional improvement and carryover between visitation sesssions. Patient is making progress towards long term goals, with decrease in numbness symptoms and improvement in strength. Patient will  benefit from further skilled therapy focused on improving limitations to return to prior level of function.    Rehab Potential Good   Clinical Impairments Affecting Rehab Potential (-) Age (+) highly motivated   PT Frequency 2x / week   PT Duration 8 weeks   PT Treatment/Interventions Therapeutic activities;Therapeutic exercise;Balance training;Iontophoresis /ml Dexamethasone;Aquatic Therapy;Electrical Stimulation;Cryotherapy;Ultrasound;Neuromuscular re-education;Patient/family education;Gait training;Stair training;Manual techniques   PT Next Visit Plan Progress strengthening and balance exercises   Consulted and Agree with Plan of Care Patient      Patient will benefit from skilled therapeutic intervention in order to improve the following deficits and impairments:  Abnormal gait, Decreased endurance, Decreased range of motion, Decreased strength, Decreased balance, Pain, Decreased coordination, Decreased mobility, Difficulty walking  Visit Diagnosis: Difficulty in walking, not elsewhere classified - Plan: PT plan of care cert/re-cert  Muscle weakness (generalized) - Plan: PT plan of care cert/re-cert     Problem List Patient Active Problem List   Diagnosis Date Noted  . Spondylolisthesis, grade 2 12/26/2014  . Spondylisthesis 12/26/2014    [name], PT DPT 01/05/2017, 10:35 AM  [place] [facility] PHYSICAL AND SPORTS MEDICINE [address] [place], [address] Phone: [phone]   Fax:  [phone]  Name: Isaiah Rogers MRN: [id number] Date of Birth: 1945-06-17

## 2017-01-09 ENCOUNTER — Ambulatory Visit: Payer: Medicare Other | Attending: Internal Medicine

## 2017-01-09 DIAGNOSIS — M6281 Muscle weakness (generalized): Secondary | ICD-10-CM | POA: Diagnosis present

## 2017-01-09 DIAGNOSIS — R262 Difficulty in walking, not elsewhere classified: Secondary | ICD-10-CM | POA: Diagnosis present

## 2017-01-09 NOTE — Therapy (Signed)
[facility] [address], [place], [address] Phone: [phone]   Fax:  [phone]  Physical Therapy Treatment  Patient Details  Name: Isaiah Rogers MRN: [id number] Date of Birth: Nov 10, 1945 Referring Provider: Barbette Reichmann MD  Encounter Date: 01/09/2017      PT End of Session - 01/09/17 1428    Visit Number 14   Number of Visits 25   Date for PT Re-Evaluation 02/02/17   Authorization Type 4 / 10 G Codes   PT Start Time 1350   PT Stop Time 1430   PT Time Calculation (min) 40 min   Activity Tolerance Patient tolerated treatment well;No increased pain   Behavior During Therapy WFL for tasks assessed/performed      Past Medical History:  Diagnosis Date  . Arthritis    lumbar spondylosis  . Diabetes mellitus without complication (HCC)    told that he has diabetes but not treated   . Hypertension   . Stroke [facility])     History reviewed. No pertinent surgical history.  There were no vitals filed for this visit.      Subjective Assessment - 01/09/17 1406    Subjective Patient reports improvement overall and states he's been performing the ball rolling on the bottom of his feet. Patient reports the ball rolling helps his feet.    Pertinent History Patient has a history of CVA 5 years ago, lumbar fusion L4-5 2 years ago, patient has a hx of diabetes,   Limitations Standing;Lifting   How long can you walk comfortably?   Currently in Pain? No/denies   Pain Onset More than a month ago      TREATMENT: Therapeutic Exercise: Heel lifts in standing off of step with UE support - Performed unilaterally 2 x 10  Side stepping across airex beam -- x 10 beam Tandem amb over airex beam - x 5 down and back  Lunges in standing with bringing - x 15 performed B Feet together balance on airex - 2 x 60sec in standing with EO and EC Hip machine hip abduction - 55# x25   Manual Therapy: STM performed to  bottom of patient's feet to decrease increased spasms and numbness with patient positioned in long sitting.    Patient demonstrates decreased numbness tingling at bottom of feet at the end of manual therapy          PT Education - 01/09/17 1428    Education provided Yes   Education Details form/technique with exercise   Person(s) Educated Patient   Methods Explanation;Demonstration   Comprehension Verbalized understanding;Returned demonstration             PT Long Term Goals - 01/05/17 1018      PT LONG TERM GOAL #1   Title Patient will be independent with HEP to continue benefits of therapy after discharge.   Baseline Dependent for exercise performance and progression; moderate cueing for form/technique    Time 8   Period Weeks   Status On-going     PT LONG TERM GOAL #2   Title Patient will improve TUG to under 9 sec to demonstrate significant improvement in balance and decrease in fall risk   Baseline 13sec; 12/20/16: 11sec; 01/05/17: 10 sec   Time 8   Period Weeks   Status On-going     PT LONG TERM GOAL #3   Title Patient will improve 5XSTS to under 10sec to demonstrate significant improvement in LE strength and  ability to stand up.    Baseline 16sec; 12/20/16: 13sec sit to stand; 01/05/17: 12sec   Time 8   Period Weeks   Status On-going     PT LONG TERM GOAL #4   Title Patient will improve to >11m/s to demonstrate significant improvement in walking ability and greater safety with community ambulation   Baseline .56m/s; 12/20/16: 1.11 m/s    Time 8   Period Weeks   Status Achieved               Plan - 01/09/17 1429    Clinical Impression Statement Patient demonstrates improvement with balance exercises with ability to perform standing exercises with less UE support compared to previous visits. Although patient is improving, he continues to demonstrate decreased strength, difficulty with dynamic/static balance, and decreased muscular endurance. Patient  will benefit from further skilled therapy to return to prior level of function.    Rehab Potential Good   Clinical Impairments Affecting Rehab Potential (-) Age (+) highly motivated   PT Frequency 2x / week   PT Duration 8 weeks   PT Treatment/Interventions Therapeutic activities;Therapeutic exercise;Balance training;Iontophoresis /ml Dexamethasone;Aquatic Therapy;Electrical Stimulation;Cryotherapy;Ultrasound;Neuromuscular re-education;Patient/family education;Gait training;Stair training;Manual techniques   PT Next Visit Plan Progress strengthening and balance exercises   Consulted and Agree with Plan of Care Patient      Patient will benefit from skilled therapeutic intervention in order to improve the following deficits and impairments:  Abnormal gait, Decreased endurance, Decreased range of motion, Decreased strength, Decreased balance, Pain, Decreased coordination, Decreased mobility, Difficulty walking  Visit Diagnosis: Difficulty in walking, not elsewhere classified  Muscle weakness (generalized)     Problem List Patient Active Problem List   Diagnosis Date Noted  . Spondylolisthesis, grade 2 12/26/2014  . Spondylisthesis 12/26/2014    [name], PT DPT 01/09/2017, 2:40 PM  [place] [facility] PHYSICAL [facility] SPORTS MEDICINE [address] [place], [address] Phone: [phone]   Fax:  [phone]  Name: Isaiah Rogers MRN: [id number] Date of Birth: 1945-07-23

## 2017-01-12 ENCOUNTER — Ambulatory Visit: Payer: Medicare Other

## 2017-01-12 DIAGNOSIS — M6281 Muscle weakness (generalized): Secondary | ICD-10-CM

## 2017-01-12 DIAGNOSIS — R262 Difficulty in walking, not elsewhere classified: Secondary | ICD-10-CM

## 2017-01-12 NOTE — Therapy (Signed)
[facility]IC[facility] [address], [place], [address] Phone: [phone]   Fax:  [phone]  Physical Therapy Treatment  Patient Details  Name: Isaiah Rogers MRN: [id number] Date of Birth: [birth year] Referring Provider: Barbette Reichmann MD  Encounter Date: 01/12/2017      PT End of Session - 01/12/17 1324    Visit Number 15   Number of Visits 25   Date for PT Re-Evaluation 02/02/17   Authorization Type 5 / 10 G Codes   PT Start Time 1305   PT Stop Time 1345   PT Time Calculation (min) 40 min   Activity Tolerance Patient tolerated treatment well;No increased pain   Behavior During Therapy WFL for tasks assessed/performed      Past Medical History:  Diagnosis Date  . Arthritis    lumbar spondylosis  . Diabetes mellitus without complication (HCC)    told that he has diabetes but not treated   . Hypertension   . Stroke [facility])     History reviewed. No pertinent surgical history.  There were no vitals filed for this visit.      Subjective Assessment - 01/12/17 1308    Subjective Patient reports he mostly performs the ball roll outs under his feet the most out of all the exercises performed at home. Patient reports he is a little better today then he was in the past.    Pertinent History Patient has a history of CVA 5 years ago, lumbar fusion L4-5 2 years ago, patient has a hx of diabetes,   Limitations Standing;Lifting   How long can you walk comfortably?   Currently in Pain? No/denies   Pain Onset More than a month ago      TREATMENT: Therapeutic Exercise: Heel lifts in standing off of step with UE support - Performed unilaterally  x 15 Step ups onto 2nd step in standing - x10 Single leg stance rotations with Intermittent UE support - x10 Single leg ball toss - x 10 on each leg   Manual Therapy: STM performed to bottom of patient's feet and calfs to decrease increased spasms and numbness  with patient positioned in long sitting.    Patient demonstrates decreased numbness tingling at bottom of feet at the end of manual therapy       PT Education - 01/12/17 1310    Education provided Yes   Education Details form/technique with exercise   Person(s) Educated Patient   Methods Explanation;Demonstration   Comprehension Verbalized understanding;Returned demonstration             PT Long Term Goals - 01/05/17 1018      PT LONG TERM GOAL #1   Title Patient will be independent with HEP to continue benefits of therapy after discharge.   Baseline Dependent for exercise performance and progression; moderate cueing for form/technique    Time 8   Period Weeks   Status On-going     PT LONG TERM GOAL #2   Title Patient will improve TUG to under 9 sec to demonstrate significant improvement in balance and decrease in fall risk   Baseline 13sec; 12/20/16: 11sec; 01/05/17: 10 sec   Time 8   Period Weeks   Status On-going     PT LONG TERM GOAL #3   Title Patient will improve 5XSTS to under 10sec to demonstrate significant improvement in LE strength and ability to stand up.    Baseline 16sec; 12/20/16: 13sec sit to stand; 01/05/17: 12sec  Time 8   Period Weeks   Status On-going     PT LONG TERM GOAL #4   Title Patient will improve to >80m/s to demonstrate significant improvement in walking ability and greater safety with community ambulation   Baseline .82m/s; 12/20/16: 1.11 m/s    Time 8   Period Weeks   Status Achieved               Plan - 01/12/17 1340    Clinical Impression Statement Patient reports continuous improvement with numbness and tingling after performing manual therapy. Patient demonstrated improvement with single leg balance with ability to perform single leg balance with ball toss with intermittent UE support. Although patient is improving he continues to demonstate decreased static balance most notebly with single leg support most likely from  poor feedback from the numbness in his legs.    Rehab Potential Good   Clinical Impairments Affecting Rehab Potential (-) Age (+) highly motivated   PT Frequency 2x / week   PT Duration 8 weeks   PT Treatment/Interventions Therapeutic activities;Therapeutic exercise;Balance training;Iontophoresis /ml Dexamethasone;Aquatic Therapy;Electrical Stimulation;Cryotherapy;Ultrasound;Neuromuscular re-education;Patient/family education;Gait training;Stair training;Manual techniques   PT Next Visit Plan Progress strengthening and balance exercises   Consulted and Agree with Plan of Care Patient      Patient will benefit from skilled therapeutic intervention in order to improve the following deficits and impairments:  Abnormal gait, Decreased endurance, Decreased range of motion, Decreased strength, Decreased balance, Pain, Decreased coordination, Decreased mobility, Difficulty walking  Visit Diagnosis: Difficulty in walking, not elsewhere classified  Muscle weakness (generalized)     Problem List Patient Active Problem List   Diagnosis Date Noted  . Spondylolisthesis, grade 2 12/26/2014  . Spondylisthesis 12/26/2014    [name], PT DPT 01/12/2017, 1:56 PM  [facility] PHYSICAL AND SPORTS MEDICINE [address], [place], [address] Phone: [phone]   Fax:  [phone]  Name: Isaiah Rogers MRN: [id number] Date of Birth: 05/27/1945

## 2017-01-16 ENCOUNTER — Ambulatory Visit: Payer: Medicare Other

## 2017-01-16 DIAGNOSIS — M6281 Muscle weakness (generalized): Secondary | ICD-10-CM

## 2017-01-16 DIAGNOSIS — R262 Difficulty in walking, not elsewhere classified: Secondary | ICD-10-CM

## 2017-01-16 NOTE — Therapy (Signed)
[facility]TS [facility]IC[facility] [address], [place], [address] Phone: [phone]   Fax:  [phone]  Physical Therapy Treatment  Patient Details  Name: Isaiah Rogers MRN: [id number] Date of Birth: October 11, 1945 Referring Provider: Barbette Reichmann MD  Encounter Date: 01/16/2017      PT End of Session - 01/16/17 1319    Visit Number 16   Number of Visits 25   Date for PT Re-Evaluation 02/02/17   Authorization Type 6 / 10 G Codes   PT Start Time 1305   PT Stop Time 1345   PT Time Calculation (min) 40 min   Activity Tolerance Patient tolerated treatment well;No increased pain   Behavior During Therapy WFL for tasks assessed/performed      Past Medical History:  Diagnosis Date  . Arthritis    lumbar spondylosis  . Diabetes mellitus without complication (HCC)    told that he has diabetes but not treated   . Hypertension   . Stroke [facility])     History reviewed. No pertinent surgical history.  There were no vitals filed for this visit.      Subjective Assessment - 01/16/17 1307    Subjective Patient reports he feels he conitnues to minorly improve. Patient reports he's been performing exercises at home.    Pertinent History Patient has a history of CVA 5 years ago, lumbar fusion L4-5 2 years ago, patient has a hx of diabetes,   Limitations Standing;Lifting   How long can you walk comfortably?   Currently in Pain? No/denies   Pain Onset More than a month ago        TREATMENT: Therapeutic Exercise: Heel lifts in standing off of step with UE support - Performed bilaterally 2 x 20 Single leg ball toss - x 15 on each leg Tandem ambulation on airex beam - x8 down and back Step ups onto Bosu ball - x 15 B Side stepping up and down bosu ball - x 15 B   Manual Therapy: STM performed to bottom of patient's feet to decrease increased spasms and numbness with patient positioned in long sitting.    Patient  demonstrates increased strength with exercise       PT Education - 01/16/17 1319    Education provided Yes   Education Details form/technique with exercise   Person(s) Educated Patient   Methods Explanation;Demonstration   Comprehension Verbalized understanding;Returned demonstration             PT Long Term Goals - 01/05/17 1018      PT LONG TERM GOAL #1   Title Patient will be independent with HEP to continue benefits of therapy after discharge.   Baseline Dependent for exercise performance and progression; moderate cueing for form/technique    Time 8   Period Weeks   Status On-going     PT LONG TERM GOAL #2   Title Patient will improve TUG to under 9 sec to demonstrate significant improvement in balance and decrease in fall risk   Baseline 13sec; 12/20/16: 11sec; 01/05/17: 10 sec   Time 8   Period Weeks   Status On-going     PT LONG TERM GOAL #3   Title Patient will improve 5XSTS to under 10sec to demonstrate significant improvement in LE strength and ability to stand up.    Baseline 16sec; 12/20/16: 13sec sit to stand; 01/05/17: 12sec   Time 8   Period Weeks   Status On-going     PT  LONG TERM GOAL #4   Title Patient will improve to >6m/s to demonstrate significant improvement in walking ability and greater safety with community ambulation   Baseline .71m/s; 12/20/16: 1.11 m/s    Time 8   Period Weeks   Status Achieved               Plan - 01/16/17 1327    Clinical Impression Statement Patient demonstrates decreased balance on the L LE versus the R LE and requires step down to maintain balance. However, demonstrates increased difficulty with performing step ups on bosu ball indicating decreased strength on the R vs L. Patient demonstrates improved single leg stance ability compared to previous visits and patient will benefit from further skilled therapy to return to prior level of function.    Rehab Potential Good   Clinical Impairments Affecting Rehab  Potential (-) Age (+) highly motivated   PT Frequency 2x / week   PT Duration 8 weeks   PT Treatment/Interventions Therapeutic activities;Therapeutic exercise;Balance training;Iontophoresis /ml Dexamethasone;Aquatic Therapy;Electrical Stimulation;Cryotherapy;Ultrasound;Neuromuscular re-education;Patient/family education;Gait training;Stair training;Manual techniques   PT Next Visit Plan Progress strengthening and balance exercises   Consulted and Agree with Plan of Care Patient      Patient will benefit from skilled therapeutic intervention in order to improve the following deficits and impairments:  Abnormal gait, Decreased endurance, Decreased range of motion, Decreased strength, Decreased balance, Pain, Decreased coordination, Decreased mobility, Difficulty walking  Visit Diagnosis: Difficulty in walking, not elsewhere classified  Muscle weakness (generalized)     Problem List Patient Active Problem List   Diagnosis Date Noted  . Spondylolisthesis, grade 2 12/26/2014  . Spondylisthesis 12/26/2014    [name], PT DPT 01/16/2017, 1:47 PM  [facility] PHYSICAL AND SPORTS MEDICINE [address] [place], [address] Phone: [phone]   Fax:  [phone]  Name: Isaiah Rogers MRN: [id number] Date of Birth: 05-Jul-1945

## 2017-01-18 ENCOUNTER — Ambulatory Visit: Payer: Medicare Other

## 2017-01-18 DIAGNOSIS — M6281 Muscle weakness (generalized): Secondary | ICD-10-CM

## 2017-01-18 DIAGNOSIS — R262 Difficulty in walking, not elsewhere classified: Secondary | ICD-10-CM

## 2017-01-18 NOTE — Therapy (Signed)
[facility] PH[facility]TS MEDICINE [address], [place], [address] Phone: [phone]   Fax:  [phone]  Physical Therapy Treatment  Patient Details  Name: Isaiah Rogers MRN: [id number] Date of Birth: 02/07/1946 Referring Provider: Barbette Reichmann MD  Encounter Date: 01/18/2017      PT End of Session - 01/18/17 1327    Visit Number 17   Number of Visits 25   Date for PT Re-Evaluation 02/02/17   Authorization Type 7 / 10 G Codes   PT Start Time 1302   PT Stop Time 1345   PT Time Calculation (min) 43 min   Activity Tolerance Patient tolerated treatment well;No increased pain   Behavior During Therapy WFL for tasks assessed/performed      Past Medical History:  Diagnosis Date  . Arthritis    lumbar spondylosis  . Diabetes mellitus without complication (HCC)    told that he has diabetes but not treated   . Hypertension   . Stroke [facility])     History reviewed. No pertinent surgical history.  There were no vitals filed for this visit.      Subjective Assessment - 01/18/17 1304    Subjective Patient reports a lot of improvement since the start of the session. Patient reports he would like to be completely better.    Pertinent History Patient has a history of CVA 5 years ago, lumbar fusion L4-5 2 years ago, patient has a hx of diabetes,   Limitations Standing;Lifting   How long can you walk comfortably?   Currently in Pain? No/denies   Pain Onset More than a month ago      TREATMENT: Therapeutic Exercise: Feet together balance on blue side of Bosu ball - 2 min  Step ups onto the blue side of bosu - x15  Single leg ball toss - 2 x 15 on each leg Squats on Bosu ball - x 20 Unilaterally Heel lifts in standing off of step with UE support - Performed bilaterally 2 x 20   Manual Therapy: STM performed to bottom of patient's feet to decrease increased spasms and numbness with patient positioned in long sitting.     Patient demonstrates increased strength with exercise       PT Education - 01/18/17 1327    Education provided Yes   Education Details form/technique with exercise   Person(s) Educated Patient   Methods Explanation;Demonstration   Comprehension Verbalized understanding;Returned demonstration             PT Long Term Goals - 01/05/17 1018      PT LONG TERM GOAL #1   Title Patient will be independent with HEP to continue benefits of therapy after discharge.   Baseline Dependent for exercise performance and progression; moderate cueing for form/technique    Time 8   Period Weeks   Status On-going     PT LONG TERM GOAL #2   Title Patient will improve TUG to under 9 sec to demonstrate significant improvement in balance and decrease in fall risk   Baseline 13sec; 12/20/16: 11sec; 01/05/17: 10 sec   Time 8   Period Weeks   Status On-going     PT LONG TERM GOAL #3   Title Patient will improve 5XSTS to under 10sec to demonstrate significant improvement in LE strength and ability to stand up.    Baseline 16sec; 12/20/16: 13sec sit to stand; 01/05/17: 12sec   Time 8   Period Weeks   Status On-going  PT LONG TERM GOAL #4   Title Patient will improve to >71m/s to demonstrate significant improvement in walking ability and greater safety with community ambulation   Baseline .84m/s; 12/20/16: 1.11 m/s    Time 8   Period Weeks   Status Achieved               Plan - 01/18/17 1330    Clinical Impression Statement Patient demonstrates improvement with balancing exercises with patient requiring less step downs with the elevated side to maintain balance. Patient demonstrates decreased lower exetremity strength most notably with the gastrocnemius, hamstring, and quadratus musculature. Patient will benefit from further skilled therapy to return to prior level of function.    Rehab Potential Good   Clinical Impairments Affecting Rehab Potential (-) Age (+) highly motivated    PT Frequency 2x / week   PT Duration 8 weeks   PT Treatment/Interventions Therapeutic activities;Therapeutic exercise;Balance training;Iontophoresis /ml Dexamethasone;Aquatic Therapy;Electrical Stimulation;Cryotherapy;Ultrasound;Neuromuscular re-education;Patient/family education;Gait training;Stair training;Manual techniques   PT Next Visit Plan Progress strengthening and balance exercises   Consulted and Agree with Plan of Care Patient      Patient will benefit from skilled therapeutic intervention in order to improve the following deficits and impairments:  Abnormal gait, Decreased endurance, Decreased range of motion, Decreased strength, Decreased balance, Pain, Decreased coordination, Decreased mobility, Difficulty walking  Visit Diagnosis: Difficulty in walking, not elsewhere classified  Muscle weakness (generalized)     Problem List Patient Active Problem List   Diagnosis Date Noted  . Spondylolisthesis, grade 2 12/26/2014  . Spondylisthesis 12/26/2014    [name], PT DPT 01/18/2017, 1:56 PM  [facility] PHYSICAL AND SPORTS MEDICINE [address], [place], [address] Phone: [phone]   Fax:  [phone]  Name: Isaiah Rogers MRN: [id number] Date of Birth: [birth year]

## 2017-01-23 ENCOUNTER — Ambulatory Visit: Payer: Medicare Other

## 2017-01-23 DIAGNOSIS — R262 Difficulty in walking, not elsewhere classified: Secondary | ICD-10-CM

## 2017-01-23 DIAGNOSIS — M6281 Muscle weakness (generalized): Secondary | ICD-10-CM

## 2017-01-23 NOTE — Therapy (Signed)
[place] [facility] PH[facility]ORTS MEDICINE [address] [place], [address] Phone: [phone]   Fax:  [phone]  Physical Therapy Evaluation  Patient Details  Name: Isaiah Rogers MRN: [id number] Date of Birth: [birth year] Referring Provider: Barbette Reichmann MD  Encounter Date: 01/23/2017      PT End of Session - 01/23/17 1316    Visit Number 18   Number of Visits 25   Date for PT Re-Evaluation 02/02/17   Authorization Type 8 / 10 G Codes   PT Start Time 1302   PT Stop Time 1345   PT Time Calculation (min) 43 min   Activity Tolerance Patient tolerated treatment well;No increased pain   Behavior During Therapy WFL for tasks assessed/performed      Past Medical History:  Diagnosis Date  . Arthritis    lumbar spondylosis  . Diabetes mellitus without complication (HCC)    told that he has diabetes but not treated   . Hypertension   . Stroke [facility])     History reviewed. No pertinent surgical history.  There were no vitals filed for this visit.       Subjective Assessment - 01/23/17 1304    Subjective Paient reports improvement since the start of therapy and believes the massage to the bottom of the feet is helping with the pain.    Pertinent History Patient has a history of CVA 5 years ago, lumbar fusion L4-5 2 years ago, patient has a hx of diabetes,   Limitations Standing;Lifting   How long can you walk comfortably?   Currently in Pain? No/denies   Pain Onset More than a month ago      Objective measurements completed on examination: See above findings.    TREATMENT: Therapeutic Exercise: Side stepping up and over Bosu ball - x 10  Feet on two half foam rollers with ball toss - x 20 thrown outside of BOS Feet together balance on half foam - x60 sec Step ups onto Bosu ball blue with intermittent UE support -  2 x 10  Squats on Bosu ball - 2 x 15 Single leg ball toss - 2 x 15 on each leg Unilaterally Heel  lifts in standing off of step with UE support - Performed bilaterally 2 x 20   Manual Therapy: STM performed to bottom of patient's R foot to decrease increased spasms and numbness with patient positioned in long sitting.    Patient demonstrates increased strength with exercise      PT Education - 01/23/17 1315    Education provided Yes   Education Details form/technique with exercise   Person(s) Educated Patient   Methods Explanation;Demonstration   Comprehension Verbalized understanding;Returned demonstration             PT Long Term Goals - 01/05/17 1018      PT LONG TERM GOAL #1   Title Patient will be independent with HEP to continue benefits of therapy after discharge.   Baseline Dependent for exercise performance and progression; moderate cueing for form/technique    Time 8   Period Weeks   Status On-going     PT LONG TERM GOAL #2   Title Patient will improve TUG to under 9 sec to demonstrate significant improvement in balance and decrease in fall risk   Baseline 13sec; 12/20/16: 11sec; 01/05/17: 10 sec   Time 8   Period Weeks   Status On-going     PT LONG TERM GOAL #3   Title  Patient will improve 5XSTS to under 10sec to demonstrate significant improvement in LE strength and ability to stand up.    Baseline 16sec; 12/20/16: 13sec sit to stand; 01/05/17: 12sec   Time 8   Period Weeks   Status On-going     PT LONG TERM GOAL #4   Title Patient will improve to >26m/s to demonstrate significant improvement in walking ability and greater safety with community ambulation   Baseline .46m/s; 12/20/16: 1.11 m/s    Time 8   Period Weeks   Status Achieved                Plan - 01/23/17 1326    Clinical Impression Statement Patient demonstrates decreased balance and stabilization with performing standing balance exercises requiring UE support to perform exercises. Patient demonstrates improvement with exercises requiring less UE support to perform squats on  Bosu but conotinues to require to demonstrate increased postural sway with exercises. Patient will benefit from further skilled therapy to return to prior level of function.     Rehab Potential Good   Clinical Impairments Affecting Rehab Potential (-) Age (+) highly motivated   PT Frequency 2x / week   PT Duration 8 weeks   PT Treatment/Interventions Therapeutic activities;Therapeutic exercise;Balance training;Iontophoresis /ml Dexamethasone;Aquatic Therapy;Electrical Stimulation;Cryotherapy;Ultrasound;Neuromuscular re-education;Patient/family education;Gait training;Stair training;Manual techniques   PT Next Visit Plan Progress strengthening and balance exercises   Consulted and Agree with Plan of Care Patient      Patient will benefit from skilled therapeutic intervention in order to improve the following deficits and impairments:  Abnormal gait, Decreased endurance, Decreased range of motion, Decreased strength, Decreased balance, Pain, Decreased coordination, Decreased mobility, Difficulty walking  Visit Diagnosis: Difficulty in walking, not elsewhere classified  Muscle weakness (generalized)     Problem List Patient Active Problem List   Diagnosis Date Noted  . Spondylolisthesis, grade 2 12/26/2014  . Spondylisthesis 12/26/2014    [name], PT DPT 01/23/2017, 1:36 PM  [place] [facility] PHYSICAL AND SPORTS MEDICINE [address], [place], [address] Phone: [phone]   Fax:  [phone]  Name: Isaiah Rogers MRN: [id number] Date of Birth: Dec 18, 1945

## 2017-01-25 ENCOUNTER — Ambulatory Visit: Payer: Medicare Other

## 2017-01-25 DIAGNOSIS — R262 Difficulty in walking, not elsewhere classified: Secondary | ICD-10-CM

## 2017-01-25 DIAGNOSIS — M6281 Muscle weakness (generalized): Secondary | ICD-10-CM

## 2017-01-25 NOTE — Therapy (Signed)
[place] [facility] PHYSICAL [facility] SPORTS MEDICINE [address], [place], [address] Phone: [phone]   Fax:  [phone]  Physical Therapy Treatment  Patient Details  Name: Isaiah Rogers MRN: [id number] Date of Birth: Nov 23, 1945 Referring Provider: Barbette Reichmann MD  Encounter Date: 01/25/2017      PT End of Session - 01/25/17 1347    Visit Number 19   Number of Visits 25   Date for PT Re-Evaluation 02/02/17   Authorization Type 9 / 10 G Codes   PT Start Time 1330   PT Stop Time 1415   PT Time Calculation (min) 45 min   Activity Tolerance Patient tolerated treatment well;No increased pain   Behavior During Therapy WFL for tasks assessed/performed      Past Medical History:  Diagnosis Date  . Arthritis    lumbar spondylosis  . Diabetes mellitus without complication (HCC)    told that he has diabetes but not treated   . Hypertension   . Stroke [facility])     History reviewed. No pertinent surgical history.  There were no vitals filed for this visit.      Subjective Assessment - 01/25/17 1333    Subjective Patient reports increased improvement in the mornings and states decreased pain after performing the manual ball rolling under the feet. Patient reports he's greatly improved since the start of therapy.    Pertinent History Patient has a history of CVA 5 years ago, lumbar fusion L4-5 2 years ago, patient has a hx of diabetes,   Limitations Standing;Lifting   How long can you walk comfortably?   Currently in Pain? No/denies   Pain Onset More than a month ago        TREATMENT: Therapeutic Exercise: Unilaterally Heel lifts in standing off of step with UE support - Performed bilaterally  x 20 Tandem stance ambulation on airex beam - down and back x 5 Single leg stance ball throws in standing on airex pad - x 10 B  Feet together balance on half foam roller - x 2 min without UE support  Hip abduction off of airex beam - 2 x  15 Squats in standing on airex beam - 2 x 15  Manual Therapy: STM performed to bottom of patient's R foot to decrease increased spasms and numbness with patient positioned in long sitting.    Patient demonstrates increased strength with       PT Education - 01/25/17 1345    Education provided Yes   Education Details form/technique with exercise   Person(s) Educated Patient   Methods Explanation;Demonstration   Comprehension Verbalized understanding;Returned demonstration             PT Long Term Goals - 01/05/17 1018      PT LONG TERM GOAL #1   Title Patient will be independent with HEP to continue benefits of therapy after discharge.   Baseline Dependent for exercise performance and progression; moderate cueing for form/technique    Time 8   Period Weeks   Status On-going     PT LONG TERM GOAL #2   Title Patient will improve TUG to under 9 sec to demonstrate significant improvement in balance and decrease in fall risk   Baseline 13sec; 12/20/16: 11sec; 01/05/17: 10 sec   Time 8   Period Weeks   Status On-going     PT LONG TERM GOAL #3   Title Patient will improve 5XSTS to under 10sec to demonstrate significant improvement in LE  strength and ability to stand up.    Baseline 16sec; 12/20/16: 13sec sit to stand; 01/05/17: 12sec   Time 8   Period Weeks   Status On-going     PT LONG TERM GOAL #4   Title Patient will improve 10mWT to >6986m/s to demonstrate significant improvement in walking ability and greater safety with community ambulation   Baseline .3650m/s; 12/20/16: 1.11 m/s    Time 8   Period Weeks   Status Achieved               Plan - 01/25/17 1354    Clinical Impression Statement Patient demonstrates improvement in standing balance most noteably with dynamic balance with ability to require only minimal UE use with exercise performance. Patient conitnues to demonstrate increased numbness and radiating symptoms along the bottom of the feet and will benefit  from further skilled therapy to return to prior level of function.    Rehab Potential Good   Clinical Impairments Affecting Rehab Potential (-) Age (+) highly motivated   PT Frequency 2x / week   PT Duration 8 weeks   PT Treatment/Interventions Therapeutic activities;Therapeutic exercise;Balance training;Iontophoresis 4mg /ml Dexamethasone;Aquatic Therapy;Electrical Stimulation;Cryotherapy;Ultrasound;Neuromuscular re-education;Patient/family education;Gait training;Stair training;Manual techniques   PT Next Visit Plan Progress strengthening and balance exercises   Consulted and Agree with Plan of Care Patient      Patient will benefit from skilled therapeutic intervention in order to improve the following deficits and impairments:  Abnormal gait, Decreased endurance, Decreased range of motion, Decreased strength, Decreased balance, Pain, Decreased coordination, Decreased mobility, Difficulty walking  Visit Diagnosis: Difficulty in walking, not elsewhere classified  Muscle weakness (generalized)     Problem List Patient Active Problem List   Diagnosis Date Noted  . Spondylolisthesis, grade 2 12/26/2014  . Spondylisthesis 12/26/2014    Myrene GalasWesley Robertha Staples, PT DPT 01/25/2017, 2:13 PM  [place] [facility] [address] [place], [place], [address] Phone: [phone]   Fax:  [phone]  Name: Margarette AsalOscar Inghram MRN: [id number] Date of Birth: Jan 23, 1946

## 2017-01-31 ENCOUNTER — Ambulatory Visit: Payer: Medicare Other

## 2017-01-31 DIAGNOSIS — M6281 Muscle weakness (generalized): Secondary | ICD-10-CM

## 2017-01-31 DIAGNOSIS — R262 Difficulty in walking, not elsewhere classified: Secondary | ICD-10-CM | POA: Diagnosis not present

## 2017-01-31 NOTE — Therapy (Signed)
[place] [facility] PHYSICAL [facility] SPORTS MEDICINE [address], [place], [address] Phone: [phone]   Fax:  [phone]  Physical Therapy Treatment  Patient Details  Name: Isaiah Rogers MRN: [id number] Date of Birth: Aug 22, 1945 Referring Provider: Barbette Reichmann MD  Encounter Date: 01/31/2017      PT End of Session - 01/31/17 1337    Visit Number 20   Number of Visits 25   Date for PT Re-Evaluation 02/28/17   Authorization Type 10 / 10 G Codes   PT Start Time 1300   PT Stop Time 1345   PT Time Calculation (min) 45 min   Activity Tolerance Patient tolerated treatment well;No increased pain   Behavior During Therapy WFL for tasks assessed/performed      Past Medical History:  Diagnosis Date  . Arthritis    lumbar spondylosis  . Diabetes mellitus without complication (HCC)    told that he has diabetes but not treated   . Hypertension   . Stroke [facility])     History reviewed. No pertinent surgical history.  There were no vitals filed for this visit.      Subjective Assessment - 01/31/17 1319    Subjective Patient reports decreased pain and states he'sbeen improving "a little bit" over the past few weeks. Patient states he continues to have numbness and tingling into his feet.    Pertinent History Patient has a history of CVA 5 years ago, lumbar fusion L4-5 2 years ago, patient has a hx of diabetes,   Limitations Standing;Lifting   How long can you walk comfortably?   Currently in Pain? No/denies   Pain Onset More than a month ago        TREATMENT: Therapeutic Exercise: Unilaterally Heel lifts in standing off of step with UE support - Performed bilaterally  2 x 20 Feet apart squats on black side of bosu ball - x 20  Side stepping across airex beam - x 10  Tip toe walking - x 10 (28m x 10) Lunges in standing with UE support - x 10 TUG sit to stands with walking - x 5    Manual Therapy: STM performed to bottom of  patient's R foot to decrease increased spasms and numbness with patient positioned in long sitting.    Patient demonstrates decreased symptoms after treatment session.           PT Education - 01/31/17 1325    Education provided Yes   Education Details form/technique with exercise   Person(s) Educated Patient   Methods Explanation;Demonstration   Comprehension Verbalized understanding;Returned demonstration             PT Long Term Goals - 01/31/17 1328      PT LONG TERM GOAL #1   Title Patient will be independent with HEP to continue benefits of therapy after discharge.   Baseline Dependent for exercise performance and progression; moderate cueing for form/technique    Time 8   Period Weeks   Status On-going     PT LONG TERM GOAL #2   Title Patient will improve TUG to under 9 sec to demonstrate significant improvement in balance and decrease in fall risk   Baseline 13sec; 12/20/16: 11sec; 01/05/17: 10 sec; 01/31/17: 9.2 sec    Time 8   Period Weeks   Status On-going     PT LONG TERM GOAL #3   Title Patient will improve 5XSTS to under 10sec to demonstrate significant improvement in LE strength and  ability to stand up.    Baseline 16sec; 12/20/16: 13sec sit to stand; 01/05/17: 12sec; 01/31/2017: 9.8sec   Time 8   Period Weeks   Status Achieved     PT LONG TERM GOAL #4   Title Patient will improve 10mWT to >7421m/s to demonstrate significant improvement in walking ability and greater safety with community ambulation   Baseline .8036m/s; 12/20/16: 1.11 m/s    Time 8   Period Weeks   Status Achieved               Plan - 01/31/17 1454    Clinical Impression Statement Patient is making progress towards long term goals with improved times to completion with the TUG and 5xSTS indicating functional carryover and decreased fall risk. Patient also reports he has been having less pain, numbness and tingling at home. Although patient is improving, he continues to demonstrate  decreased single leg support and decreased balance within narrow base of support. Patient will benefit from further skilled therapy focused on improving limitations to return to prior level of function.    Rehab Potential Good   Clinical Impairments Affecting Rehab Potential (-) Age (+) highly motivated   PT Frequency 2x / week   PT Duration 8 weeks   PT Treatment/Interventions Therapeutic activities;Therapeutic exercise;Balance training;Iontophoresis 4mg /ml Dexamethasone;Aquatic Therapy;Electrical Stimulation;Cryotherapy;Ultrasound;Neuromuscular re-education;Patient/family education;Gait training;Stair training;Manual techniques   PT Next Visit Plan Progress strengthening and balance exercises   Consulted and Agree with Plan of Care Patient      Patient will benefit from skilled therapeutic intervention in order to improve the following deficits and impairments:  Abnormal gait, Decreased endurance, Decreased range of motion, Decreased strength, Decreased balance, Pain, Decreased coordination, Decreased mobility, Difficulty walking  Visit Diagnosis: Muscle weakness (generalized)  Difficulty in walking, not elsewhere classified       G-Codes - 01/31/17 1459    Functional Assessment Tool Used (Outpatient Only) Clinical Judgement, TUG, 5XSTS, 10mwt   Functional Limitation Mobility: Walking and moving around   Mobility: Walking and Moving Around Current Status ([id number]) At least 1 percent but less than 20 percent impaired, limited or restricted   Mobility: Walking and Moving Around Goal Status [phone](G8979) At least 1 percent but less than 20 percent impaired, limited or restricted      Problem List Patient Active Problem List   Diagnosis Date Noted  . Spondylolisthesis, grade 2 12/26/2014  . Spondylisthesis 12/26/2014    Myrene GalasWesley Juleah Paradise, PT DPT 01/31/2017, 3:00 PM  [place] [facility] [address] , [place], [address] Phone:  [phone]   Fax:  [phone]  Name: Isaiah AsalOscar Rogers MRN: [id number] Date of Birth: [birth year]

## 2017-02-02 ENCOUNTER — Ambulatory Visit: Payer: Medicare Other

## 2017-02-02 DIAGNOSIS — M6281 Muscle weakness (generalized): Secondary | ICD-10-CM

## 2017-02-02 DIAGNOSIS — R262 Difficulty in walking, not elsewhere classified: Secondary | ICD-10-CM

## 2017-02-02 NOTE — Therapy (Signed)
[place] [address] , [place], [address] Phone: [phone]   Fax:  [phone]  Physical Therapy Treatment  Patient Details  Name: Isaiah Rogers MRN: [id number] Date of Birth: 02-15-1946 Referring Provider: Barbette ReichmannVishwanath Hande MD  Encounter Date: 02/02/2017      PT End of Session - 02/02/17 1036    Visit Number 21   Number of Visits 25   Date for PT Re-Evaluation 02/28/17   Authorization Type 1 / 10 G Codes   PT Start Time 0945   PT Stop Time 1030   PT Time Calculation (min) 45 min   Activity Tolerance Patient tolerated treatment well;No increased pain   Behavior During Therapy WFL for tasks assessed/performed      Past Medical History:  Diagnosis Date  . Arthritis    lumbar spondylosis  . Diabetes mellitus without complication (HCC)    told that he has diabetes but not treated   . Hypertension   . Stroke [facility](HCC)     History reviewed. No pertinent surgical history.  There were no vitals filed for this visit.      Subjective Assessment - 02/02/17 1009    Subjective Patient reports his balance and symptoms have greatly improved since the start of therapy. Patient states he would like to continue therapy as he continues to improve.    Pertinent History Patient has a history of CVA 5 years ago, lumbar fusion L4-5 2 years ago, patient has a hx of diabetes,   Limitations Standing;Lifting   How long can you walk comfortably? 15min   Currently in Pain? No/denies   Pain Onset More than a month ago        TREATMENT: Therapeutic Exercise: Unilaterally Heel lifts/ toe lifts in standing on airex pad- Performed bilaterally  2 x 20 Feet apart squats on airex pad with 20# weight of bosu ball - x 20  Tip toe walking - (8962m x 10) Lunges in standing with UE support onto airex - x 10 Leg Press [facility] - 2 x 15 95# Tandem ambulation - 6310ft x 10    Manual Therapy: STM performed to bottom of  patient's R foot to decrease increased spasms and numbness with patient positioned in long sitting.    Patient demonstrates increased fatigue after treatment session.      PT Education - 02/02/17 1009    Education provided Yes   Education Details form/technique with exercise   Person(s) Educated Patient   Methods Explanation;Demonstration   Comprehension Verbalized understanding;Returned demonstration             PT Long Term Goals - 01/31/17 1328      PT LONG TERM GOAL #1   Title Patient will be independent with HEP to continue benefits of therapy after discharge.   Baseline Dependent for exercise performance and progression; moderate cueing for form/technique    Time 8   Period Weeks   Status On-going     PT LONG TERM GOAL #2   Title Patient will improve TUG to under 9 sec to demonstrate significant improvement in balance and decrease in fall risk   Baseline 13sec; 12/20/16: 11sec; 01/05/17: 10 sec; 01/31/17: 9.2 sec    Time 8   Period Weeks   Status On-going     PT LONG TERM GOAL #3   Title Patient will improve 5XSTS to under 10sec to demonstrate significant improvement in LE strength and ability to stand up.    Baseline  16sec; 12/20/16: 13sec sit to stand; 01/05/17: 12sec; 01/31/2017: 9.8sec   Time 8   Period Weeks   Status Achieved     PT LONG TERM GOAL #4   Title Patient will improve to >27m/s to demonstrate significant improvement in walking ability and greater safety with community ambulation   Baseline .36m/s; 12/20/16: 1.11 m/s    Time 8   Period Weeks   Status Achieved               Plan - 02/02/17 1039    Clinical Impression Statement Patient demonstrates improvement with single leg stance exercises with ability to perform exercise with little to no UE support. Although patient is improving, he continues to demonstrate increased balance defeicts most notable with performing  dynamic balance with narrow BOS. Patient will benefit from further  skilled therapy focused on improving limtations to return to prior level of function.    Rehab Potential Good   Clinical Impairments Affecting Rehab Potential (-) Age (+) highly motivated   PT Frequency 2x / week   PT Duration 8 weeks   PT Treatment/Interventions Therapeutic activities;Therapeutic exercise;Balance training;Iontophoresis 4mg /ml Dexamethasone;Aquatic Therapy;Electrical Stimulation;Cryotherapy;Ultrasound;Neuromuscular re-education;Patient/family education;Gait training;Stair training;Manual techniques   PT Next Visit Plan Progress strengthening and balance exercises   Consulted and Agree with Plan of Care Patient      Patient will benefit from skilled therapeutic intervention in order to improve the following deficits and impairments:  Abnormal gait, Decreased endurance, Decreased range of motion, Decreased strength, Decreased balance, Pain, Decreased coordination, Decreased mobility, Difficulty walking  Visit Diagnosis: Difficulty in walking, not elsewhere classified  Muscle weakness (generalized)     Problem List Patient Active Problem List   Diagnosis Date Noted  . Spondylolisthesis, grade 2 12/26/2014  . Spondylisthesis 12/26/2014    [name], PT DPT 02/02/2017, 10:53 AM  [place] [facility] PHYSICAL AND SPORTS MEDICINE [address], [place], [address] Phone: [phone]   Fax:  [phone]  Name: Isaiah Rogers MRN: [id number] Date of Birth: Sep 18, 1945

## 2017-02-07 ENCOUNTER — Ambulatory Visit: Payer: Medicare Other

## 2017-02-07 DIAGNOSIS — M6281 Muscle weakness (generalized): Secondary | ICD-10-CM

## 2017-02-07 DIAGNOSIS — R262 Difficulty in walking, not elsewhere classified: Secondary | ICD-10-CM | POA: Diagnosis not present

## 2017-02-07 NOTE — Therapy (Signed)
[facility] [address] [place], [address] Phone: [phone]   Fax:  [phone]  Physical Therapy Treatment  Patient Details  Name: Isaiah Rogers MRN: [id number] Date of Birth: 06-07-1945 Referring Provider: Barbette Reichmann MD  Encounter Date: 02/07/2017      PT End of Session - 02/07/17 1322    Visit Number 22   Number of Visits 25   Date for PT Re-Evaluation 02/28/17   Authorization Type 2 / 10 G Codes   PT Start Time 1301   PT Stop Time 1345   PT Time Calculation (min) 44 min   Activity Tolerance Patient tolerated treatment well;No increased pain   Behavior During Therapy WFL for tasks assessed/performed      Past Medical History:  Diagnosis Date  . Arthritis    lumbar spondylosis  . Diabetes mellitus without complication (HCC)    told that he has diabetes but not treated   . Hypertension   . Stroke [facility])     No past surgical history on file.  There were no vitals filed for this visit.      Subjective Assessment - 02/07/17 1319    Subjective Patient reports his balance and symptoms have been improving a little bit. Patient states no major changes since the previous visit.    Pertinent History Patient has a history of CVA 5 years ago, lumbar fusion L4-5 2 years ago, patient has a hx of diabetes,   Limitations Standing;Lifting   How long can you walk comfortably?   Currently in Pain? No/denies   Pain Onset More than a month ago         TREATMENT: Therapeutic Exercise: Unilaterally Heel lifts/ toe lifts in standing on airex pad- Performed bilaterally  2 x 20 Single leg stance on blue airex pad with intermittent UE support ball toss- x 15 Squats on two large dynadiscs - x 20  Tip toe walking - 34ft x 4 Tandem ambulation - 69ft x 4  Side Stepping with ball toss - 4 x 32ft Forward walking with ball toss in air - 4 x 57ft   Manual Therapy: STM performed to bottom of  patient's R foot to decrease increased spasms and numbness with patient positioned in long sitting.    Patient demonstrates increased fatigue after treatment session.       PT Education - 02/07/17 1321    Education provided Yes   Education Details form/technique with exercise   Person(s) Educated Patient   Methods Explanation;Demonstration   Comprehension Verbalized understanding;Returned demonstration             PT Long Term Goals - 01/31/17 1328      PT LONG TERM GOAL #1   Title Patient will be independent with HEP to continue benefits of therapy after discharge.   Baseline Dependent for exercise performance and progression; moderate cueing for form/technique    Time 8   Period Weeks   Status On-going     PT LONG TERM GOAL #2   Title Patient will improve TUG to under 9 sec to demonstrate significant improvement in balance and decrease in fall risk   Baseline 13sec; 12/20/16: 11sec; 01/05/17: 10 sec; 01/31/17: 9.2 sec    Time 8   Period Weeks   Status On-going     PT LONG TERM GOAL #3   Title Patient will improve 5XSTS to under 10sec to demonstrate significant improvement in LE strength and ability to stand up.  Baseline 16sec; 12/20/16: 13sec sit to stand; 01/05/17: 12sec; 01/31/2017: 9.8sec   Time 8   Period Weeks   Status Achieved     PT LONG TERM GOAL #4   Title Patient will improve 10mWT to >7972m/s to demonstrate significant improvement in walking ability and greater safety with community ambulation   Baseline .834m/s; 12/20/16: 1.11 m/s    Time 8   Period Weeks   Status Achieved               Plan - 02/07/17 1345    Clinical Impression Statement Patient demonstrates decreased coordination and dynamic balance with movement most likely from poor motor planning and tactile foot responses. Patient demonstrates improvement in tandem ambulation but continues difficulty with balancing within narrow base of support. Patient will benefit from further skilled  therapy to return to prior level of function to return to prior level of function.   Rehab Potential Good   Clinical Impairments Affecting Rehab Potential (-) Age (+) highly motivated   PT Frequency 2x / week   PT Duration 8 weeks   PT Treatment/Interventions Therapeutic activities;Therapeutic exercise;Balance training;Iontophoresis 4mg /ml Dexamethasone;Aquatic Therapy;Electrical Stimulation;Cryotherapy;Ultrasound;Neuromuscular re-education;Patient/family education;Gait training;Stair training;Manual techniques   PT Next Visit Plan Progress strengthening and balance exercises   Consulted and Agree with Plan of Care Patient      Patient will benefit from skilled therapeutic intervention in order to improve the following deficits and impairments:  Abnormal gait, Decreased endurance, Decreased range of motion, Decreased strength, Decreased balance, Pain, Decreased coordination, Decreased mobility, Difficulty walking  Visit Diagnosis: Muscle weakness (generalized)  Difficulty in walking, not elsewhere classified     Problem List Patient Active Problem List   Diagnosis Date Noted  . Spondylolisthesis, grade 2 12/26/2014  . Spondylisthesis 12/26/2014    Myrene GalasWesley Emiel Kielty, PT DPT 02/07/2017, 1:49 PM  [place] [facility] [address] [place], [place], [address] Phone: [phone]   Fax:  [phone]  Name: Isaiah Rogers MRN: [id number] Date of Birth: 07-17-1945

## 2017-02-09 ENCOUNTER — Ambulatory Visit: Payer: Medicare Other | Attending: Internal Medicine

## 2017-02-09 DIAGNOSIS — M6281 Muscle weakness (generalized): Secondary | ICD-10-CM

## 2017-02-09 DIAGNOSIS — R262 Difficulty in walking, not elsewhere classified: Secondary | ICD-10-CM

## 2017-02-09 NOTE — Therapy (Signed)
[facility] [address] [place], [place], [address] Phone: [phone]   Fax:  [phone]  Physical Therapy Treatment  Patient Details  Name: Isaiah Rogers MRN: [id number] Date of Birth: 10/22/1945 Referring Provider: Barbette ReichmannVishwanath Hande MD  Encounter Date: 02/09/2017      PT End of Session - 02/09/17 1420    Visit Number 23   Number of Visits 25   Date for PT Re-Evaluation 02/28/17   Authorization Type 3 / 10 G Codes   PT Start Time 1300   PT Stop Time 1345   PT Time Calculation (min) 45 min   Activity Tolerance Patient tolerated treatment well;No increased pain   Behavior During Therapy WFL for tasks assessed/performed      Past Medical History:  Diagnosis Date  . Arthritis    lumbar spondylosis  . Diabetes mellitus without complication (HCC)    told that he has diabetes but not treated   . Hypertension   . Stroke [facility](HCC)     History reviewed. No pertinent surgical history.  There were no vitals filed for this visit.      Subjective Assessment - 02/09/17 1306    Subjective Patient reports he feels his symtpoms are gradullay improving with exercise performance and patient states he has a light 'tingling' sensation along the bottom on his right foot.    Pertinent History Patient has a history of CVA 5 years ago, lumbar fusion L4-5 2 years ago, patient has a hx of diabetes,   Limitations Standing;Lifting   How long can you walk comfortably? 15min   Currently in Pain? No/denies   Pain Onset More than a month ago        TREATMENT: Therapeutic Exercise: Side stepping with performing heel raises- 6 x 5010ft Tandem ambulation - 5510ft x 6 forward and backward Hip abduction at hip machine with 55# -- 2 x 15 Single leg stance ball toss - x 20 B  Manual Therapy: STM performed to bottom of patient's R foot to decrease increased spasms and numbness with patient positioned in long sitting.    Patient  demonstrates increased fatigue after treatment session.       PT Education - 02/09/17 1420    Education provided Yes   Education Details form/technique with exercise   Person(s) Educated Patient   Methods Explanation;Demonstration   Comprehension Verbalized understanding;Returned demonstration             PT Long Term Goals - 01/31/17 1328      PT LONG TERM GOAL #1   Title Patient will be independent with HEP to continue benefits of therapy after discharge.   Baseline Dependent for exercise performance and progression; moderate cueing for form/technique    Time 8   Period Weeks   Status On-going     PT LONG TERM GOAL #2   Title Patient will improve TUG to under 9 sec to demonstrate significant improvement in balance and decrease in fall risk   Baseline 13sec; 12/20/16: 11sec; 01/05/17: 10 sec; 01/31/17: 9.2 sec    Time 8   Period Weeks   Status On-going     PT LONG TERM GOAL #3   Title Patient will improve 5XSTS to under 10sec to demonstrate significant improvement in LE strength and ability to stand up.    Baseline 16sec; 12/20/16: 13sec sit to stand; 01/05/17: 12sec; 01/31/2017: 9.8sec   Time 8   Period Weeks   Status Achieved  PT LONG TERM GOAL #4   Title Patient will improve to >22m/s to demonstrate significant improvement in walking ability and greater safety with community ambulation   Baseline .59m/s; 12/20/16: 1.11 m/s    Time 8   Period Weeks   Status Achieved               Plan - 02/09/17 1420    Clinical Impression Statement Patient continues to demonstrate decreased coordination with exercise with noted decrease in balance with narrow BOS and dynamic balance. Patient requires intermittent UE to perform exercises and patient will benefit from further skilled therapy to return to prior level of function.    Rehab Potential Good   Clinical Impairments Affecting Rehab Potential (-) Age (+) highly motivated   PT Frequency 2x / week   PT  Duration 8 weeks   PT Treatment/Interventions Therapeutic activities;Therapeutic exercise;Balance training;Iontophoresis 4mg /ml Dexamethasone;Aquatic Therapy;Electrical Stimulation;Cryotherapy;Ultrasound;Neuromuscular re-education;Patient/family education;Gait training;Stair training;Manual techniques   PT Next Visit Plan Progress strengthening and balance exercises   Consulted and Agree with Plan of Care Patient      Patient will benefit from skilled therapeutic intervention in order to improve the following deficits and impairments:  Abnormal gait, Decreased endurance, Decreased range of motion, Decreased strength, Decreased balance, Pain, Decreased coordination, Decreased mobility, Difficulty walking  Visit Diagnosis: Muscle weakness (generalized)  Difficulty in walking, not elsewhere classified     Problem List Patient Active Problem List   Diagnosis Date Noted  . Spondylolisthesis, grade 2 12/26/2014  . Spondylisthesis 12/26/2014    [name], PT DPT 02/09/2017, 2:23 PM  [facility] PHYSICAL AND SPORTS MEDICINE [address] [place], [address] Phone: [phone]   Fax:  [phone]  Name: Isaiah Rogers MRN: [id number] Date of Birth: [birth year]

## 2017-02-14 ENCOUNTER — Ambulatory Visit: Payer: Medicare Other

## 2017-02-14 DIAGNOSIS — R262 Difficulty in walking, not elsewhere classified: Secondary | ICD-10-CM

## 2017-02-14 DIAGNOSIS — M6281 Muscle weakness (generalized): Secondary | ICD-10-CM

## 2017-02-14 NOTE — Therapy (Signed)
[facility] [address] [place], [place], [address] Phone: [phone]   Fax:  [phone]  Physical Therapy Treatment  Patient Details  Name: Isaiah Rogers MRN: [id number] Date of Birth: May 02, 1945 Referring Provider: Barbette ReichmannVishwanath Hande MD   Encounter Date: 02/14/2017  PT End of Session - 02/14/17 1106    Visit Number  24    Number of Visits  33    Date for PT Re-Evaluation  02/28/17    Authorization Type  4 / 10 G Codes    PT Start Time  1033    PT Stop Time  1115    PT Time Calculation (min)  42 min    Activity Tolerance  Patient tolerated treatment well;No increased pain    Behavior During Therapy  WFL for tasks assessed/performed       Past Medical History:  Diagnosis Date  . Arthritis    lumbar spondylosis  . Diabetes mellitus without complication (HCC)    told that he has diabetes but not treated   . Hypertension   . Stroke [facility](HCC)     History reviewed. No pertinent surgical history.  There were no vitals filed for this visit.  Subjective Assessment - 02/14/17 1034    Subjective  Patient reports no major changes since the previous visit. Patient reports he continues to perform exercises at home.     Pertinent History  Patient has a history of CVA 5 years ago, lumbar fusion L4-5 2 years ago, patient has a hx of diabetes,    Limitations  Standing;Lifting    How long can you walk comfortably?  15min    Currently in Pain?  No/denies    Pain Onset  More than a month ago       TREATMENT: Therapeutic Exercise: Feet together balance on large dynadisc - x 100sec, x100sec with head turns up/down  Lunges in standing with one foot onto airex pad - 2 x 12 Heel raises off of a step with intermittent UE support - 2 x 12 Forward/backward ambulation on balance stones - x14 (four balance stones)   Manual Therapy: STM performed to bottom of patient's R foot to decrease increased spasms and numbness with  patient positioned in long sitting.    Patient demonstrates increased fatigue after treatment session.   PT Education - 02/14/17 1104    Education provided  Yes    Education Details  form/technique with exercise    Person(s) Educated  Patient    Methods  Explanation;Demonstration    Comprehension  Verbalized understanding;Returned demonstration          PT Long Term Goals - 01/31/17 1328      PT LONG TERM GOAL #1   Title  Patient will be independent with HEP to continue benefits of therapy after discharge.    Baseline  Dependent for exercise performance and progression; moderate cueing for form/technique     Time  8    Period  Weeks    Status  On-going      PT LONG TERM GOAL #2   Title  Patient will improve TUG to under 9 sec to demonstrate significant improvement in balance and decrease in fall risk    Baseline  13sec; 12/20/16: 11sec; 01/05/17: 10 sec; 01/31/17: 9.2 sec     Time  8    Period  Weeks    Status  On-going      PT LONG TERM GOAL #3   Title  Patient  will improve 5XSTS to under 10sec to demonstrate significant improvement in LE strength and ability to stand up.     Baseline  16sec; 12/20/16: 13sec sit to stand; 01/05/17: 12sec; 01/31/2017: 9.8sec    Time  8    Period  Weeks    Status  Achieved      PT LONG TERM GOAL #4   Title  Patient will improve 10mWT to >226m/s to demonstrate significant improvement in walking ability and greater safety with community ambulation    Baseline  .4554m/s; 12/20/16: 1.11 m/s     Time  8    Period  Weeks    Status  Achieved            Plan - 02/14/17 1111    Clinical Impression Statement  Patient demonstrates difficulty with in narrow base of support in standing requiring intermittent UE support to perform exercise most notable with movement/dynamic balance. Patient continues to have decreased strength with narrow BOS positions and will benefit from further skilled therapy focused on improving limitations to return to prior  level of function.     Rehab Potential  Good    Clinical Impairments Affecting Rehab Potential  (-) Age (+) highly motivated    PT Frequency  2x / week    PT Duration  8 weeks    PT Treatment/Interventions  Therapeutic activities;Therapeutic exercise;Balance training;Iontophoresis 4mg /ml Dexamethasone;Aquatic Therapy;Electrical Stimulation;Cryotherapy;Ultrasound;Neuromuscular re-education;Patient/family education;Gait training;Stair training;Manual techniques    PT Next Visit Plan  Progress strengthening and balance exercises    Consulted and Agree with Plan of Care  Patient       Patient will benefit from skilled therapeutic intervention in order to improve the following deficits and impairments:  Abnormal gait, Decreased endurance, Decreased range of motion, Decreased strength, Decreased balance, Pain, Decreased coordination, Decreased mobility, Difficulty walking  Visit Diagnosis: Muscle weakness (generalized)  Difficulty in walking, not elsewhere classified     Problem List Patient Active Problem List   Diagnosis Date Noted  . Spondylolisthesis, grade 2 12/26/2014  . Spondylisthesis 12/26/2014    Myrene GalasWesley Lyrick Lagrand, PT DPT 02/14/2017, 11:19 AM  [facility] [address] [place], [place], [address] Phone: [phone]   Fax:  [phone]  Name: Isaiah Rogers MRN: [id number] Date of Birth: 10-Jul-1945

## 2017-02-16 ENCOUNTER — Ambulatory Visit: Payer: Medicare Other

## 2017-02-16 DIAGNOSIS — M6281 Muscle weakness (generalized): Secondary | ICD-10-CM

## 2017-02-16 DIAGNOSIS — R262 Difficulty in walking, not elsewhere classified: Secondary | ICD-10-CM

## 2017-02-16 NOTE — Therapy (Signed)
[facility] [address] [place], [place], [address] Phone: [phone]   Fax:  [phone]  Physical Therapy Treatment  Patient Details  Name: Margarette AsalOscar Spickler MRN: [id number] Date of Birth: 1946/02/16 Referring Provider: Barbette ReichmannVishwanath Hande MD   Encounter Date: 02/16/2017  PT End of Session - 02/16/17 1009    Visit Number  25    Number of Visits  33    Date for PT Re-Evaluation  02/28/17    Authorization Type  5 / 10 G Codes    PT Start Time  1115    PT Stop Time  1030    PT Time Calculation (min)  1395 min    Activity Tolerance  Patient tolerated treatment well;No increased pain    Behavior During Therapy  WFL for tasks assessed/performed       Past Medical History:  Diagnosis Date  . Arthritis    lumbar spondylosis  . Diabetes mellitus without complication (HCC)    told that he has diabetes but not treated   . Hypertension   . Stroke [facility](HCC)     History reviewed. No pertinent surgical history.  There were no vitals filed for this visit.  Subjective Assessment - 02/16/17 1005    Subjective  Patient reports he continues to feel a little bit better after each visit. Patient states he's had increased pain after having a back surgery years ago with greater symptoms running distally down the legs.     Pertinent History  Patient has a history of CVA 5 years ago, lumbar fusion L4-5 2 years ago, patient has a hx of diabetes,    Limitations  Standing;Lifting    How long can you walk comfortably?  15min    Currently in Pain?  No/denies    Pain Onset  More than a month ago       TREATMENT: Therapeutic Exercise: Heel raises off of a step with intermittent UE support - x 15, x20  Hip Extension with RTB around ankles - x15 on L, x 20 on R  Cone taps from standing on 2 balance stones - x 10  Single leg press at OMEGA - 3 x 10 45#, 55# x 2 Step ups onto 10" step - 2 x 20 with UE support    Manual Therapy: STM  performed to bottom of patient's R foot to decrease increased spasms and numbness with patient positioned in long sitting.    Patient demonstrates increased fatigue after treatment session.     PT Education - 02/16/17 1008    Education provided  Yes    Education Details  form/technique with exercise    Person(s) Educated  Patient    Methods  Explanation;Demonstration    Comprehension  Verbalized understanding;Returned demonstration          PT Long Term Goals - 01/31/17 1328      PT LONG TERM GOAL #1   Title  Patient will be independent with HEP to continue benefits of therapy after discharge.    Baseline  Dependent for exercise performance and progression; moderate cueing for form/technique     Time  8    Period  Weeks    Status  On-going      PT LONG TERM GOAL #2   Title  Patient will improve TUG to under 9 sec to demonstrate significant improvement in balance and decrease in fall risk    Baseline  13sec; 12/20/16: 11sec; 01/05/17: 10 sec; 01/31/17: 9.2 sec  Time  8    Period  Weeks    Status  On-going      PT LONG TERM GOAL #3   Title  Patient will improve 5XSTS to under 10sec to demonstrate significant improvement in LE strength and ability to stand up.     Baseline  16sec; 12/20/16: 13sec sit to stand; 01/05/17: 12sec; 01/31/2017: 9.8sec    Time  8    Period  Weeks    Status  Achieved      PT LONG TERM GOAL #4   Title  Patient will improve 10mWT to >1234m/s to demonstrate significant improvement in walking ability and greater safety with community ambulation    Baseline  .6563m/s; 12/20/16: 1.11 m/s     Time  8    Period  Weeks    Status  Achieved            Plan - 02/16/17 1113    Clinical Impression Statement  Patient demonstrates improvement with exercise performance and focused on improving LE strength most noteably within the the L LE secondary to difficulty maintaining straight leg posture in single leg stance heel raises. Patient demonstrates increased  fatigue with exercises indicating decreased muscular endurance and patient will benefit from further skilled therapy to return to prior level of function.     Rehab Potential  Good    Clinical Impairments Affecting Rehab Potential  (-) Age (+) highly motivated    PT Frequency  2x / week    PT Duration  8 weeks    PT Treatment/Interventions  Therapeutic activities;Therapeutic exercise;Balance training;Iontophoresis 4mg /ml Dexamethasone;Aquatic Therapy;Electrical Stimulation;Cryotherapy;Ultrasound;Neuromuscular re-education;Patient/family education;Gait training;Stair training;Manual techniques    PT Next Visit Plan  Progress strengthening and balance exercises    Consulted and Agree with Plan of Care  Patient       Patient will benefit from skilled therapeutic intervention in order to improve the following deficits and impairments:  Abnormal gait, Decreased endurance, Decreased range of motion, Decreased strength, Decreased balance, Pain, Decreased coordination, Decreased mobility, Difficulty walking  Visit Diagnosis: Difficulty in walking, not elsewhere classified  Muscle weakness (generalized)     Problem List Patient Active Problem List   Diagnosis Date Noted  . Spondylolisthesis, grade 2 12/26/2014  . Spondylisthesis 12/26/2014    Myrene GalasWesley Greydon Betke, PT DPT 02/16/2017, 11:58 AM  [place] [facility] [address] [place], [place], [address] Phone: [phone]   Fax:  [phone]  Name: Margarette AsalOscar Haran MRN: [id number] Date of Birth: [birth year]

## 2017-02-21 ENCOUNTER — Ambulatory Visit: Payer: Medicare Other

## 2017-02-21 DIAGNOSIS — M6281 Muscle weakness (generalized): Secondary | ICD-10-CM

## 2017-02-21 DIAGNOSIS — R262 Difficulty in walking, not elsewhere classified: Secondary | ICD-10-CM

## 2017-02-21 NOTE — Therapy (Signed)
[place] [facility] [address] [place], [place], [address] Phone: [phone]   Fax:  [phone]  Physical Therapy Treatment  Patient Details  Name: Isaiah Rogers MRN: [id number] Date of Birth: [birth year] Referring Provider: Barbette ReichmannVishwanath Hande MD   Encounter Date: 02/21/2017  PT End of Session - 02/21/17 1101    Visit Number  26    Number of Visits  33    Date for PT Re-Evaluation  02/28/17    Authorization Type  6 / 10 G Codes    PT Start Time  1030    PT Stop Time  1115    PT Time Calculation (min)  45 min    Activity Tolerance  Patient tolerated treatment well;No increased pain    Behavior During Therapy  WFL for tasks assessed/performed       Past Medical History:  Diagnosis Date  . Arthritis    lumbar spondylosis  . Diabetes mellitus without complication (HCC)    told that he has diabetes but not treated   . Hypertension   . Stroke [facility]HCC)     History reviewed. No pertinent surgical history.  There were no vitals filed for this visit.  Subjective Assessment - 02/21/17 1055    Subjective  Patient reports no major changes since the previous visit. Patient reports she had an uneventful weekend.     Pertinent History  Patient has a history of CVA 5 years ago, lumbar fusion L4-5 2 years ago, patient has a hx of diabetes,    Limitations  Standing;Lifting    How long can you walk comfortably?  15min    Currently in Pain?  No/denies    Pain Onset  More than a month ago         TREATMENT: Therapeutic Exercise: Heel raises off of a step with intermittent UE support - x 15, x20  Heel raises side stepping along airex beam - x10 down and back Widened tandem weight shifts forward/backward - 2 x 15 Tandem ambulation along airex beam - 2 x 15  Hip extension at Hip machine - x15 B 70#   Manual Therapy: STM performed to bottom of patient's R foot to decrease increased spasms and numbness with patient positioned  in long sitting.    Patient demonstrates increased fatigue after treatment session.    PT Education - 02/21/17 1100    Education provided  Yes    Education Details  form/technique with exercise    Person(s) Educated  Patient    Methods  Explanation;Demonstration    Comprehension  Verbalized understanding;Returned demonstration          PT Long Term Goals - 01/31/17 1328      PT LONG TERM GOAL #1   Title  Patient will be independent with HEP to continue benefits of therapy after discharge.    Baseline  Dependent for exercise performance and progression; moderate cueing for form/technique     Time  8    Period  Weeks    Status  On-going      PT LONG TERM GOAL #2   Title  Patient will improve TUG to under 9 sec to demonstrate significant improvement in balance and decrease in fall risk    Baseline  13sec; 12/20/16: 11sec; 01/05/17: 10 sec; 01/31/17: 9.2 sec     Time  8    Period  Weeks    Status  On-going      PT LONG TERM GOAL #3  Title  Patient will improve 5XSTS to under 10sec to demonstrate significant improvement in LE strength and ability to stand up.     Baseline  16sec; 12/20/16: 13sec sit to stand; 01/05/17: 12sec; 01/31/2017: 9.8sec    Time  8    Period  Weeks    Status  Achieved      PT LONG TERM GOAL #4   Title  Patient will improve 10mWT to >4464m/s to demonstrate significant improvement in walking ability and greater safety with community ambulation    Baseline  .3179m/s; 12/20/16: 1.11 m/s     Time  8    Period  Weeks    Status  Achieved            Plan - 02/21/17 1102    Clinical Impression Statement  Continued to focus on improving LE strength with challenging his dynamic/static balance to decrease fall risk and improve standing strength. Patient continues to demonstrate improvement with dynamic balance exercise requiring less UE support to perform. Although patient is improving, he continues to demonstrate decreased strength and baalnce most noably with  performing dynamic balance. Patient will benefit from further skilled therapy to return to prior level of function.     Rehab Potential  Good    Clinical Impairments Affecting Rehab Potential  (-) Age (+) highly motivated    PT Frequency  2x / week    PT Duration  8 weeks    PT Treatment/Interventions  Therapeutic activities;Therapeutic exercise;Balance training;Iontophoresis 4mg /ml Dexamethasone;Aquatic Therapy;Electrical Stimulation;Cryotherapy;Ultrasound;Neuromuscular re-education;Patient/family education;Gait training;Stair training;Manual techniques    PT Next Visit Plan  Progress strengthening and balance exercises    Consulted and Agree with Plan of Care  Patient       Patient will benefit from skilled therapeutic intervention in order to improve the following deficits and impairments:  Abnormal gait, Decreased endurance, Decreased range of motion, Decreased strength, Decreased balance, Pain, Decreased coordination, Decreased mobility, Difficulty walking  Visit Diagnosis: Difficulty in walking, not elsewhere classified  Muscle weakness (generalized)     Problem List Patient Active Problem List   Diagnosis Date Noted  . Spondylolisthesis, grade 2 12/26/2014  . Spondylisthesis 12/26/2014    Myrene GalasWesley Alvina Strother, PT DPT 02/21/2017, 11:10 AM  [place] [facility] [address] [place], [place], [address] Phone: [phone]   Fax:  [phone]  Name: Isaiah AsalOscar Dierolf MRN: [id number] Date of Birth: 05-10-1945

## 2017-02-23 ENCOUNTER — Ambulatory Visit: Payer: Medicare Other

## 2017-02-23 DIAGNOSIS — M6281 Muscle weakness (generalized): Secondary | ICD-10-CM | POA: Diagnosis not present

## 2017-02-23 DIAGNOSIS — R262 Difficulty in walking, not elsewhere classified: Secondary | ICD-10-CM

## 2017-02-23 NOTE — Therapy (Signed)
[place] [facility] [address] [place], [place], [address] Phone: [phone]   Fax:  [phone]  Physical Therapy Treatment  Patient Details  Name: Isaiah Rogers MRN: [id number] Date of Birth: [birth year] Referring Provider: Barbette ReichmannVishwanath Hande MD   Encounter Date: 02/23/2017  PT End of Session - 02/23/17 1029    Visit Number  27    Number of Visits  33    Date for PT Re-Evaluation  02/28/17    Authorization Type  7 / 10 G Codes    PT Start Time  0955    PT Stop Time  1035    PT Time Calculation (min)  40 min    Activity Tolerance  Patient tolerated treatment well;No increased pain    Behavior During Therapy  WFL for tasks assessed/performed       Past Medical History:  Diagnosis Date  . Arthritis    lumbar spondylosis  . Diabetes mellitus without complication (HCC)    told that he has diabetes but not treated   . Hypertension   . Stroke [facility](HCC)     History reviewed. No pertinent surgical history.  There were no vitals filed for this visit.  Subjective Assessment - 02/23/17 1026    Subjective  Patient reports he continues to have increased symptoms along the bottom of his right foot running posteriorly down the leg. Patient reports his strength has improved  a little bit.     Pertinent History  Patient has a history of CVA 5 years ago, lumbar fusion L4-5 2 years ago, patient has a hx of diabetes,    Limitations  Standing;Lifting    How long can you walk comfortably?  15min    Currently in Pain?  No/denies    Pain Onset  More than a month ago       TREATMENT: Therapeutic Exercise: Heel raises off of a step with intermittent UE support - x 15, x20  Single leg stance on BAPS board - 1.165min B level 2  Single leg step ups onto BOSU ball - x 20 B Side step ups onto Bosu Ball - x20  Marches on Bosu ball - x 20 B LE Hip extension at Hip machine - x15 B 70# Hip abduction in standing - x 20 55#   Manual  Therapy: STM performed to bottom of patient's R foot to decrease increased spasms and numbness with patient positioned in long sitting.    Patient demonstrates increased fatigue after treatment session.  PT Education - 02/23/17 1029    Education provided  Yes    Education Details  form/technique with exercise    Person(s) Educated  Patient    Methods  Explanation;Demonstration    Comprehension  Verbalized understanding;Returned demonstration          PT Long Term Goals - 01/31/17 1328      PT LONG TERM GOAL #1   Title  Patient will be independent with HEP to continue benefits of therapy after discharge.    Baseline  Dependent for exercise performance and progression; moderate cueing for form/technique     Time  8    Period  Weeks    Status  On-going      PT LONG TERM GOAL #2   Title  Patient will improve TUG to under 9 sec to demonstrate significant improvement in balance and decrease in fall risk    Baseline  13sec; 12/20/16: 11sec; 01/05/17: 10 sec; 01/31/17: 9.2 sec  [id number] [facility]4Servando SnareEdsel Petrin<MEAS G>nJ< eidelb72ergal>[facility] [address]2w4dOptician, dispensingEulogio BearKarsten Ro2Margo CommonLorain ChildesAdriana ReamsRush [facility] Hospital17Servando SnareEdsel Petrin   [phone] Murrell ReddenEber JonesDurenda HurtAltamese CarolinaRenato GailsRedge GainerEvette GeorgesGeraldo PitterJulio [phone] [facility]64Alvie Heidelberg2[facility]16Cletus Gash[address]4w4dOptician, dispensingEulogio BearKarsten Ro14Margo CommonLorain ChildesAdriana ReamsSouth Texas Ambulatory [facility] PLLC67Servando SnareEdsel Petrin   [phone] Murrell ReddenEber Jones

## 2017-02-28 ENCOUNTER — Ambulatory Visit: Payer: Medicare Other

## 2017-02-28 DIAGNOSIS — M6281 Muscle weakness (generalized): Secondary | ICD-10-CM | POA: Diagnosis not present

## 2017-02-28 DIAGNOSIS — R262 Difficulty in walking, not elsewhere classified: Secondary | ICD-10-CM

## 2017-02-28 NOTE — Therapy (Signed)
[place] [facility] [address] [place], [place], [address] Phone: [phone]   Fax:  [phone]  Physical Therapy Treatment  Patient Details  Name: Isaiah AsalOscar Rogers MRN: [id number] Date of Birth: 1945-08-24 Referring Provider: Barbette ReichmannVishwanath Hande MD   Encounter Date: 02/28/2017  PT End of Session - 02/28/17 1517    Visit Number  28    Number of Visits  33    Date for PT Re-Evaluation  02/28/17    Authorization Type  8 / 10 G Codes    PT Start Time  1430    PT Stop Time  1515    PT Time Calculation (min)  45 min    Activity Tolerance  Patient tolerated treatment well;No increased pain    Behavior During Therapy  WFL for tasks assessed/performed       Past Medical History:  Diagnosis Date  . Arthritis    lumbar spondylosis  . Diabetes mellitus without complication (HCC)    told that he has diabetes but not treated   . Hypertension   . Stroke [facility](HCC)     History reviewed. No pertinent surgical history.  There were no vitals filed for this visit.  Subjective Assessment - 02/28/17 1454    Subjective  Patient reports the pain in the bottom of his feet has improved greatly since the start of therapy but reports he continues to experience decreased leg strength.     Pertinent History  Patient has a history of CVA 5 years ago, lumbar fusion L4-5 2 years ago, patient has a hx of diabetes,    Limitations  Standing;Lifting    How long can you walk comfortably?  15min    Currently in Pain?  No/denies    Pain Onset  More than a month ago       TREATMENT: Therapeutic Exercise: Heel raises off of a step with intermittent UE support - 2x20 Hip Extension at hip machine - x20 85# Leg Press knee extension - x 20 with 105#; x10 65# Step ups onto Bosu Ball - x 20 with UE support Single leg stance - 20sec x 3 B Single leg step ups onto BOSU ball - 2 x 10 B   Manual Therapy: STM performed to bottom of patient's R foot to  decrease increased spasms and numbness with patient positioned in long sitting.    Patient demonstrates increased fatigue after treatment session.   PT Education - 02/28/17 1511    Education provided  Yes    Education Details  POC, form/technique with exercise    Person(s) Educated  Patient    Methods  Explanation;Demonstration    Comprehension  Verbalized understanding;Returned demonstration          PT Long Term Goals - 01/31/17 1328      PT LONG TERM GOAL #1   Title  Patient will be independent with HEP to continue benefits of therapy after discharge.    Baseline  Dependent for exercise performance and progression; moderate cueing for form/technique     Time  8    Period  Weeks    Status  On-going      PT LONG TERM GOAL #2   Title  Patient will improve TUG to under 9 sec to demonstrate significant improvement in balance and decrease in fall risk    Baseline  13sec; 12/20/16: 11sec; 01/05/17: 10 sec; 01/31/17: 9.2 sec     Time  8    Period  Weeks  Status  On-going      PT LONG TERM GOAL #3   Title  Patient will improve 5XSTS to under 10sec to demonstrate significant improvement in LE strength and ability to stand up.     Baseline  16sec; 12/20/16: 13sec sit to stand; 01/05/17: 12sec; 01/31/2017: 9.8sec    Time  8    Period  Weeks    Status  Achieved      PT LONG TERM GOAL #4   Title  Patient will improve 10mWT to >161m/s to demonstrate significant improvement in walking ability and greater safety with community ambulation    Baseline  .1638m/s; 12/20/16: 1.11 m/s     Time  8    Period  Weeks    Status  Achieved            Plan - 02/28/17 1527    Clinical Impression Statement  Patient demonstrates improvement in tingling symptoms but continues to have difficulty with standing balance and LE strength. Patient demonstrates improvement with LE strength compared to previous sessions indicating function carryover between sessions. Patient will benefit from further skilled  therapy to reutrn to prior level of function.    Rehab Potential  Good    Clinical Impairments Affecting Rehab Potential  (-) Age (+) highly motivated    PT Frequency  2x / week    PT Duration  8 weeks    PT Treatment/Interventions  Therapeutic activities;Therapeutic exercise;Balance training;Iontophoresis 4mg /ml Dexamethasone;Aquatic Therapy;Electrical Stimulation;Cryotherapy;Ultrasound;Neuromuscular re-education;Patient/family education;Gait training;Stair training;Manual techniques    PT Next Visit Plan  Progress strengthening and balance exercises    Consulted and Agree with Plan of Care  Patient       Patient will benefit from skilled therapeutic intervention in order to improve the following deficits and impairments:  Abnormal gait, Decreased endurance, Decreased range of motion, Decreased strength, Decreased balance, Pain, Decreased coordination, Decreased mobility, Difficulty walking  Visit Diagnosis: Muscle weakness (generalized)  Difficulty in walking, not elsewhere classified     Problem List Patient Active Problem List   Diagnosis Date Noted  . Spondylolisthesis, grade 2 12/26/2014  . Spondylisthesis 12/26/2014    Myrene GalasWesley Jahseh Lucchese, PT DPT 02/28/2017, 3:35 PM  [place] [facility] [address] [place], [place], [address] Phone: [phone]   Fax:  [phone]  Name: Isaiah AsalOscar Rogers MRN: [id number] Date of Birth: [birth year]

## 2017-03-06 ENCOUNTER — Ambulatory Visit: Payer: Medicare Other

## 2017-03-06 DIAGNOSIS — R262 Difficulty in walking, not elsewhere classified: Secondary | ICD-10-CM

## 2017-03-06 DIAGNOSIS — M6281 Muscle weakness (generalized): Secondary | ICD-10-CM | POA: Diagnosis not present

## 2017-03-06 NOTE — Therapy (Signed)
[place] [facility] [address] [place], [place], [address] Phone: [phone]   Fax:  [phone]  Physical Therapy Treatment  Patient Details  Name: Isaiah AsalOscar Rogers MRN: [id number] Date of Birth: 1946-01-14 Referring Provider: Barbette ReichmannVishwanath Hande MD   Encounter Date: 03/06/2017  PT End of Session - 03/06/17 1614    Visit Number  29    Number of Visits  33    Date for PT Re-Evaluation  04/03/17    Authorization Type  9 / 10 G Codes ([place] 1/10)    PT Start Time  1515    PT Stop Time  1600    PT Time Calculation (min)  45 min    Activity Tolerance  Patient tolerated treatment well;No increased pain    Behavior During Therapy  WFL for tasks assessed/performed       Past Medical History:  Diagnosis Date  . Arthritis    lumbar spondylosis  . Diabetes mellitus without complication (HCC)    told that he has diabetes but not treated   . Hypertension   . Stroke [facility](HCC)     History reviewed. No pertinent surgical history.  There were no vitals filed for this visit.  Subjective Assessment - 03/06/17 1615    Subjective  Patient reports his balance and strength has greatly improved since the start of therapy but reports he still feels unsteady with standing after sitting for prolonged periods of time.     Pertinent History  Patient has a history of CVA 5 years ago, lumbar fusion L4-5 2 years ago, patient has a hx of diabetes,    Limitations  Standing;Lifting    How long can you walk comfortably?  15min    Currently in Pain?  No/denies    Pain Onset  More than a month ago       TREATMENT: Therapeutic Exercise: Walking head turns up/down & left/right - 2 x 3630ft each direction Walking over obstacles - 4 x 5920ft without UE support Walking around cones - 4 x 4130ft Walking eyes closed - x 4 x 4430ft  Waling with changing speeds (fast/slow) - 4 x 3130ft  Walking Tandem Forward/backward - 4 x 7030ft with each direction Walking with  turning around after 7620ft - 10 ft x 5 Side stepping with ball toss -- 4 x 930ft Single leg stance B - 2 x 30sec without UE support  Patient demonstrates increased fatigue at end of session    PT Education - 03/06/17 1633    Education provided  Yes    Education Details  Form/technique with exercise    Person(s) Educated  Patient    Methods  Explanation;Demonstration    Comprehension  Verbalized understanding;Returned demonstration          PT Long Term Goals - 03/06/17 1527      PT LONG TERM GOAL #1   Title  Patient will be independent with HEP to continue benefits of therapy after discharge.    Baseline  Dependent for exercise performance and progression; moderate cueing for form/technique     Time  8    Period  Weeks    Status  On-going      PT LONG TERM GOAL #2   Title  Patient will improve TUG to under 9 sec to demonstrate significant improvement in balance and decrease in fall risk    Baseline  13sec; 12/20/16: 11sec; 01/05/17: 10 sec; 01/31/17: 9.2 sec 03/06/17: 8.6 sec    Time  8    Period  Weeks    Status  Achieved      PT LONG TERM GOAL #3   Title  Patient will improve 5XSTS to under 10sec to demonstrate significant improvement in LE strength and ability to stand up.     Baseline  16sec; 12/20/16: 13sec sit to stand; 01/05/17: 12sec; 01/31/2017: 9.8sec    Time  8    Period  Weeks    Status  Achieved      PT LONG TERM GOAL #4   Title  Patient will improve 10mWT to >7723m/s to demonstrate significant improvement in walking ability and greater safety with community ambulation    Baseline  .4242m/s; 12/20/16: 1.11 m/s     Time  8    Period  Weeks    Status  Achieved      PT LONG TERM GOAL #5   Title  Patient will improve DGI to 23/24 or over to indicate singificant improvement in gait mechanics and improvement in fall risk.    Baseline  20/24    Time  6    Period  Weeks    Status  New    Target Date  04/17/16            Plan - 03/06/17 1633    Clinical  Impression Statement  Patient is making progress towards long term goals with patient meeting his TUG goal and demosntrates improvement with static balance (able to stand on one foot for 12 sec B). Although patient is improving, he continues to demonstrate increased fall risk as indicated with DGI performance indicating poor dynamic balance. Patient will benefit from further skilled therapy to return to prior level of function.     Rehab Potential  Good    Clinical Impairments Affecting Rehab Potential  (-) Age (+) highly motivated    PT Frequency  2x / week    PT Duration  8 weeks    PT Treatment/Interventions  Therapeutic activities;Therapeutic exercise;Balance training;Iontophoresis 4mg /ml Dexamethasone;Aquatic Therapy;Electrical Stimulation;Cryotherapy;Ultrasound;Neuromuscular re-education;Patient/family education;Gait training;Stair training;Manual techniques    PT Next Visit Plan  Progress strengthening and balance exercises    Consulted and Agree with Plan of Care  Patient       Patient will benefit from skilled therapeutic intervention in order to improve the following deficits and impairments:  Abnormal gait, Decreased endurance, Decreased range of motion, Decreased strength, Decreased balance, Pain, Decreased coordination, Decreased mobility, Difficulty walking  Visit Diagnosis: Muscle weakness (generalized)  Difficulty in walking, not elsewhere classified   G-Codes - 03/06/17 1637    Functional Assessment Tool Used (Outpatient Only)  Clinical Judgement, TUG, 5XSTS, 10mwt    Functional Limitation  Mobility: Walking and moving around    Mobility: Walking and Moving Around Current Status ([id number])  At least 1 percent but less than 20 percent impaired, limited or restricted    Mobility: Walking and Moving Around Goal Status [phone])  At least 1 percent but less than 20 percent impaired, limited or restricted       Problem List Patient Active Problem List   Diagnosis Date Noted  .  Spondylolisthesis, grade 2 12/26/2014  . Spondylisthesis 12/26/2014    Myrene GalasWesley Ieisha Gao, PT DPT 03/06/2017, 4:38 PM  [place] [facility] [address] [place], [place], [address] Phone: [phone]   Fax:  [phone]  Name: Isaiah AsalOscar Rogers MRN: [id number] Date of Birth: 1946/01/10

## 2017-03-08 ENCOUNTER — Ambulatory Visit: Payer: Medicare Other

## 2017-03-08 DIAGNOSIS — M6281 Muscle weakness (generalized): Secondary | ICD-10-CM | POA: Diagnosis not present

## 2017-03-08 DIAGNOSIS — R262 Difficulty in walking, not elsewhere classified: Secondary | ICD-10-CM

## 2017-03-08 NOTE — Therapy (Signed)
[facility] [address] [place], [place], [address] Phone: [phone]   Fax:  [phone]  Physical Therapy Treatment  Patient Details  Name: Isaiah Rogers MRN: [id number] Date of Birth: 1945-09-06 Referring Provider: Barbette ReichmannVishwanath Hande MD   Encounter Date: 03/08/2017  PT End of Session - 03/08/17 1329    Visit Number  30    Number of Visits  41    Date for PT Re-Evaluation  04/03/17    Authorization Type  1 / 10 G Code    PT Start Time  1303    PT Stop Time  1345    PT Time Calculation (min)  42 min    Activity Tolerance  Patient tolerated treatment well;No increased pain    Behavior During Therapy  WFL for tasks assessed/performed       Past Medical History:  Diagnosis Date  . Arthritis    lumbar spondylosis  . Diabetes mellitus without complication (HCC)    told that he has diabetes but not treated   . Hypertension   . Stroke [facility](HCC)     History reviewed. No pertinent surgical history.  There were no vitals filed for this visit.  Subjective Assessment - 03/08/17 1311    Subjective  Patient reports his balance is improving but his strength in his legs continues to be decreased after sitting for long periods of time.     Pertinent History  Patient has a history of CVA 5 years ago, lumbar fusion L4-5 2 years ago, patient has a hx of diabetes,    Limitations  Standing;Lifting    How long can you walk comfortably?  15min    Currently in Pain?  No/denies    Pain Onset  More than a month ago       TREATMENT: Therapeutic Exercise: Stepping lateral lunges on airex beam - x15 Tandem ambulation on airex beam - x 15 Side stepping on airex beam - x 15  Single leg heel raises with intermittent UE support - 2 x 15  Leg Press - Quadriceps - 2 x 20 105# Single leg stance on airex pad - 2 x 30sec Side stepping with ball toss - 2 x 6430ft performed B  Forward walking with ball toss in the air - 4 x 7630ft      Patient demonstrates increased fatigue at end of session    PT Education - 03/08/17 1321    Education provided  Yes    Education Details  form/technique with exercise     Person(s) Educated  Patient    Methods  Explanation;Demonstration    Comprehension  Verbalized understanding;Returned demonstration          PT Long Term Goals - 03/06/17 1527      PT LONG TERM GOAL #1   Title  Patient will be independent with HEP to continue benefits of therapy after discharge.    Baseline  Dependent for exercise performance and progression; moderate cueing for form/technique     Time  8    Period  Weeks    Status  On-going      PT LONG TERM GOAL #2   Title  Patient will improve TUG to under 9 sec to demonstrate significant improvement in balance and decrease in fall risk    Baseline  13sec; 12/20/16: 11sec; 01/05/17: 10 sec; 01/31/17: 9.2 sec 03/06/17: 8.6 sec    Time  8    Period  Weeks    Status  Achieved      PT LONG TERM GOAL #3   Title  Patient will improve 5XSTS to under 10sec to demonstrate significant improvement in LE strength and ability to stand up.     Baseline  16sec; 12/20/16: 13sec sit to stand; 01/05/17: 12sec; 01/31/2017: 9.8sec    Time  8    Period  Weeks    Status  Achieved      PT LONG TERM GOAL #4   Title  Patient will improve 10mWT to >3937m/s to demonstrate significant improvement in walking ability and greater safety with community ambulation    Baseline  .2572m/s; 12/20/16: 1.11 m/s     Time  8    Period  Weeks    Status  Achieved      PT LONG TERM GOAL #5   Title  Patient will improve DGI to 23/24 or over to indicate singificant improvement in gait mechanics and improvement in fall risk.    Baseline  20/24    Time  6    Period  Weeks    Status  New    Target Date  04/17/16            Plan - 03/08/17 1345    Clinical Impression Statement  Patient continues to demonstrate improvement with single leg balance as well as dynamic balance with exercise.  Although patient is improving, he continues to demosntrate decreased LE strength and balance with exercise and will benefit from further skilled therapy to return to prior level of function.     Rehab Potential  Good    Clinical Impairments Affecting Rehab Potential  (-) Age (+) highly motivated    PT Frequency  2x / week    PT Duration  8 weeks    PT Treatment/Interventions  Therapeutic activities;Therapeutic exercise;Balance training;Iontophoresis 4mg /ml Dexamethasone;Aquatic Therapy;Electrical Stimulation;Cryotherapy;Ultrasound;Neuromuscular re-education;Patient/family education;Gait training;Stair training;Manual techniques    PT Next Visit Plan  Progress strengthening and balance exercises    Consulted and Agree with Plan of Care  Patient       Patient will benefit from skilled therapeutic intervention in order to improve the following deficits and impairments:  Abnormal gait, Decreased endurance, Decreased range of motion, Decreased strength, Decreased balance, Pain, Decreased coordination, Decreased mobility, Difficulty walking  Visit Diagnosis: Difficulty in walking, not elsewhere classified  Muscle weakness (generalized)     Problem List Patient Active Problem List   Diagnosis Date Noted  . Spondylolisthesis, grade 2 12/26/2014  . Spondylisthesis 12/26/2014    Myrene GalasWesley Acie Custis, PT DPT 03/08/2017, 2:10 PM  [place] [facility] [address] [place], [place], [address] Phone: [phone]   Fax:  [phone]  Name: Isaiah AsalOscar Shoaff MRN: [id number] Date of Birth: 09-29-1945

## 2017-03-13 ENCOUNTER — Ambulatory Visit: Payer: Medicare Other | Attending: Internal Medicine

## 2017-03-13 DIAGNOSIS — R262 Difficulty in walking, not elsewhere classified: Secondary | ICD-10-CM | POA: Diagnosis present

## 2017-03-13 DIAGNOSIS — M6281 Muscle weakness (generalized): Secondary | ICD-10-CM | POA: Insufficient documentation

## 2017-03-13 NOTE — Therapy (Signed)
[place] [facility] [address] [place], [place], [address] Phone: [phone]   Fax:  [phone]  Physical Therapy Treatment  Patient Details  Name: Isaiah Rogers MRN: [id number] Date of Birth: July 10, 1945 Referring Provider: Barbette ReichmannVishwanath Hande MD   Encounter Date: 03/13/2017  PT End of Session - 03/13/17 1448    Visit Number  31    Number of Visits  41    Date for PT Re-Evaluation  04/03/17    Authorization Type  2 / 10 G Code    PT Start Time  1430    PT Stop Time  1515    PT Time Calculation (min)  45 min    Activity Tolerance  Patient tolerated treatment well;No increased pain    Behavior During Therapy  WFL for tasks assessed/performed       Past Medical History:  Diagnosis Date  . Arthritis    lumbar spondylosis  . Diabetes mellitus without complication (HCC)    told that he has diabetes but not treated   . Hypertension   . Stroke [facility](HCC)     History reviewed. No pertinent surgical history.  There were no vitals filed for this visit.  Subjective Assessment - 03/13/17 1447    Subjective  Patient reports he getting a little bit better. Patient reports his balance is improving.     Pertinent History  Patient has a history of CVA 5 years ago, lumbar fusion L4-5 2 years ago, patient has a hx of diabetes,    Limitations  Standing;Lifting    How long can you walk comfortably?  15min    Currently in Pain?  No/denies    Pain Onset  More than a month ago       TREATMENT: Therapeutic Exercise: Leg Press - Quadriceps - 2 x 20 115# Hip Abduction reaches with slider - x 20  Semi circles with slider with UE support - x 20  Single leg stance on airex pad - 45sec x 2 B  Forward walking with ball toss in the air - 6 x 7930ft  Grapevine steps in front - x 4330ft B Single leg heel raises off of step - 2 x 15  Tandem ambulation without UE support - 2 x 1930ft B  Side stepping with ball toss - 4 x 8030ft performed B      Patient demonstrates increased fatigue at end of session    PT Education - 03/13/17 1448    Education provided  Yes    Education Details  form/technique with exercise    Person(s) Educated  Patient    Methods  Explanation;Demonstration    Comprehension  Verbalized understanding;Returned demonstration          PT Long Term Goals - 03/06/17 1527      PT LONG TERM GOAL #1   Title  Patient will be independent with HEP to continue benefits of therapy after discharge.    Baseline  Dependent for exercise performance and progression; moderate cueing for form/technique     Time  8    Period  Weeks    Status  On-going      PT LONG TERM GOAL #2   Title  Patient will improve TUG to under 9 sec to demonstrate significant improvement in balance and decrease in fall risk    Baseline  13sec; 12/20/16: 11sec; 01/05/17: 10 sec; 01/31/17: 9.2 sec 03/06/17: 8.6 sec    Time  8    Period  Weeks  Status  Achieved      PT LONG TERM GOAL #3   Title  Patient will improve 5XSTS to under 10sec to demonstrate significant improvement in LE strength and ability to stand up.     Baseline  16sec; 12/20/16: 13sec sit to stand; 01/05/17: 12sec; 01/31/2017: 9.8sec    Time  8    Period  Weeks    Status  Achieved      PT LONG TERM GOAL #4   Title  Patient will improve 10mWT to >6964m/s to demonstrate significant improvement in walking ability and greater safety with community ambulation    Baseline  .9227m/s; 12/20/16: 1.11 m/s     Time  8    Period  Weeks    Status  Achieved      PT LONG TERM GOAL #5   Title  Patient will improve DGI to 23/24 or over to indicate singificant improvement in gait mechanics and improvement in fall risk.    Baseline  20/24    Time  6    Period  Weeks    Status  New    Target Date  04/17/16            Plan - 03/13/17 1501    Clinical Impression Statement  Patient demonstrates decreases dynamic balance requiring sporadic lateral step outs to maintain balance. Although  patient continues to struggle with balancing, he demonstrates improvement over the past visit with ability to perform advancement of exercises. Patient will benefit from further skilled therapy to return to prior level of function.     Rehab Potential  Good    Clinical Impairments Affecting Rehab Potential  (-) Age (+) highly motivated    PT Frequency  2x / week    PT Duration  8 weeks    PT Treatment/Interventions  Therapeutic activities;Therapeutic exercise;Balance training;Iontophoresis 4mg /ml Dexamethasone;Aquatic Therapy;Electrical Stimulation;Cryotherapy;Ultrasound;Neuromuscular re-education;Patient/family education;Gait training;Stair training;Manual techniques    PT Next Visit Plan  Progress strengthening and balance exercises    Consulted and Agree with Plan of Care  Patient       Patient will benefit from skilled therapeutic intervention in order to improve the following deficits and impairments:  Abnormal gait, Decreased endurance, Decreased range of motion, Decreased strength, Decreased balance, Pain, Decreased coordination, Decreased mobility, Difficulty walking  Visit Diagnosis: Muscle weakness (generalized)  Difficulty in walking, not elsewhere classified     Problem List Patient Active Problem List   Diagnosis Date Noted  . Spondylolisthesis, grade 2 12/26/2014  . Spondylisthesis 12/26/2014    Myrene GalasWesley Tashiba Timoney, PT DPT 03/13/2017, 3:06 PM  [place] [facility] [address] [place], [place], [address] Phone: [phone]   Fax:  [phone]  Name: Isaiah Rogers MRN: [id number] Date of Birth: 03-11-1946

## 2017-03-15 ENCOUNTER — Ambulatory Visit: Payer: Medicare Other

## 2017-03-15 DIAGNOSIS — M6281 Muscle weakness (generalized): Secondary | ICD-10-CM

## 2017-03-15 DIAGNOSIS — R262 Difficulty in walking, not elsewhere classified: Secondary | ICD-10-CM

## 2017-03-15 NOTE — Therapy (Signed)
[place] [facility] [address] [place], [place], [address] Phone: [phone]   Fax:  [phone]  Physical Therapy Treatment  Patient Details  Name: Isaiah Rogers MRN: [id number] Date of Birth: 03/13/1946 Referring Provider: Barbette ReichmannVishwanath Hande MD   Encounter Date: 03/15/2017  PT End of Session - 03/15/17 1521    Visit Number  32    Number of Visits  41    Date for PT Re-Evaluation  04/03/17    Authorization Type  3 / 10 G Code    PT Start Time  1430    PT Stop Time  1515    PT Time Calculation (min)  45 min    Activity Tolerance  Patient tolerated treatment well;No increased pain    Behavior During Therapy  WFL for tasks assessed/performed       Past Medical History:  Diagnosis Date  . Arthritis    lumbar spondylosis  . Diabetes mellitus without complication (HCC)    told that he has diabetes but not treated   . Hypertension   . Stroke [facility](HCC)     History reviewed. No pertinent surgical history.  There were no vitals filed for this visit.  Subjective Assessment - 03/15/17 1505    Subjective  Patient reports he is doing a little bit better since the start of therapy. Patient reports he continues to have decreased strength.     Pertinent History  Patient has a history of CVA 5 years ago, lumbar fusion L4-5 2 years ago, patient has a hx of diabetes,    Limitations  Standing;Lifting    How long can you walk comfortably?  15min    Currently in Pain?  No/denies    Pain Onset  More than a month ago       TREATMENT: Therapeutic Exercise: Single leg heel raises off of step - 2 x 15  Single leg stance on airex pad - 45sec x 2 B  Hip abduction at hip machine - 2 x 20# #70 Sit to stands with TRX support - 2 x 30 with cueing to perform quickly Leg Press - Quadriceps - 2 x 20 115# Grapevine steps in front/ behind- x 2630ft B Tandem backward ambulation without UE support - 2 x 6830ft      Patient demonstrates increased  fatigue at end of session   PT Education - 03/15/17 1520    Education provided  Yes    Education Details  form/technique with exercise    Person(s) Educated  Patient    Methods  Explanation;Demonstration    Comprehension  Verbalized understanding;Returned demonstration          PT Long Term Goals - 03/06/17 1527      PT LONG TERM GOAL #1   Title  Patient will be independent with HEP to continue benefits of therapy after discharge.    Baseline  Dependent for exercise performance and progression; moderate cueing for form/technique     Time  8    Period  Weeks    Status  On-going      PT LONG TERM GOAL #2   Title  Patient will improve TUG to under 9 sec to demonstrate significant improvement in balance and decrease in fall risk    Baseline  13sec; 12/20/16: 11sec; 01/05/17: 10 sec; 01/31/17: 9.2 sec 03/06/17: 8.6 sec    Time  8    Period  Weeks    Status  Achieved      PT  LONG TERM GOAL #3   Title  Patient will improve 5XSTS to under 10sec to demonstrate significant improvement in LE strength and ability to stand up.     Baseline  16sec; 12/20/16: 13sec sit to stand; 01/05/17: 12sec; 01/31/2017: 9.8sec    Time  8    Period  Weeks    Status  Achieved      PT LONG TERM GOAL #4   Title  Patient will improve 10mWT to >6242m/s to demonstrate significant improvement in walking ability and greater safety with community ambulation    Baseline  .7542m/s; 12/20/16: 1.11 m/s     Time  8    Period  Weeks    Status  Achieved      PT LONG TERM GOAL #5   Title  Patient will improve DGI to 23/24 or over to indicate singificant improvement in gait mechanics and improvement in fall risk.    Baseline  20/24    Time  6    Period  Weeks    Status  New    Target Date  04/17/16            Plan - 03/15/17 1521    Clinical Impression Statement  Patient demonstrates decreased balance with activity requiring ~ 4 step outs laterally to maintain balance. Patient demonstrates good strength through  his quads but demosntrates poor unilateral support and coordination requiring UE support to maintain balance. Patient will benefit from further skilled therapy to return to prior level of function.     Rehab Potential  Good    Clinical Impairments Affecting Rehab Potential  (-) Age (+) highly motivated    PT Frequency  2x / week    PT Duration  8 weeks    PT Treatment/Interventions  Therapeutic activities;Therapeutic exercise;Balance training;Iontophoresis 4mg /ml Dexamethasone;Aquatic Therapy;Electrical Stimulation;Cryotherapy;Ultrasound;Neuromuscular re-education;Patient/family education;Gait training;Stair training;Manual techniques    PT Next Visit Plan  Progress strengthening and balance exercises    Consulted and Agree with Plan of Care  Patient       Patient will benefit from skilled therapeutic intervention in order to improve the following deficits and impairments:  Abnormal gait, Decreased endurance, Decreased range of motion, Decreased strength, Decreased balance, Pain, Decreased coordination, Decreased mobility, Difficulty walking  Visit Diagnosis: Difficulty in walking, not elsewhere classified  Muscle weakness (generalized)     Problem List Patient Active Problem List   Diagnosis Date Noted  . Spondylolisthesis, grade 2 12/26/2014  . Spondylisthesis 12/26/2014    Myrene GalasWesley Martha Soltys, PT DPT 03/15/2017, 3:46 PM  [place] [facility] [address] [place], [place], [address] Phone: [phone]   Fax:  [phone]  Name: Isaiah AsalOscar Caradonna MRN: [id number] Date of Birth: 1945-06-05

## 2017-03-20 ENCOUNTER — Ambulatory Visit: Payer: Medicare Other

## 2017-03-22 ENCOUNTER — Ambulatory Visit: Payer: Medicare Other

## 2017-03-22 DIAGNOSIS — R262 Difficulty in walking, not elsewhere classified: Secondary | ICD-10-CM

## 2017-03-22 DIAGNOSIS — M6281 Muscle weakness (generalized): Secondary | ICD-10-CM | POA: Diagnosis not present

## 2017-03-22 NOTE — Therapy (Signed)
[place] [facility] [address] [place], [place], [address] Phone: [phone]   Fax:  [phone]  Physical Therapy Treatment  Patient Details  Name: Isaiah Rogers MRN: [id number] Date of Birth: [birth year] Referring Provider: Barbette ReichmannVishwanath Hande MD   Encounter Date: 03/22/2017  PT End of Session - 03/22/17 1516    Visit Number  33    Number of Visits  41    Date for PT Re-Evaluation  04/03/17    Authorization Type  4 / 10 G Code    PT Start Time  1430    PT Stop Time  1515    PT Time Calculation (min)  45 min    Activity Tolerance  Patient tolerated treatment well;No increased pain    Behavior During Therapy  WFL for tasks assessed/performed       Past Medical History:  Diagnosis Date  . Arthritis    lumbar spondylosis  . Diabetes mellitus without complication (HCC)    told that he has diabetes but not treated   . Hypertension   . Stroke [facility](HCC)     History reviewed. No pertinent surgical history.  There were no vitals filed for this visit.  Subjective Assessment - 03/22/17 1514    Subjective  Patient reports he is feeling better and says there has not been much new since the previous session.     Pertinent History  Patient has a history of CVA 5 years ago, lumbar fusion L4-5 2 years ago, patient has a hx of diabetes,    Limitations  Standing;Lifting    How long can you walk comfortably?  15min    Currently in Pain?  No/denies    Pain Onset  More than a month ago       TREATMENT: Therapeutic Exercise: Single leg heel raises off of step - 2 x 20 Hip abduction at hip machine - 2 x 20# #70 Tandem backward ambulation without UE support - 4 x 5830ft  Circle head follows with hands on ball while walking forward - 4 x 830ft Lateral head turns while following ball in hands - 4 x 4130ft  Toe walking straight - 4 x 5330ft Side stepping with heel raises B -4 x 6230ft with ball toss with PT Leg Press - Quadriceps - 2 x 30  115# Single leg stance on airex pad with ball toss - x 10 B     Patient demonstrates increased fatigue at end of session   PT Education - 03/22/17 1515    Education provided  Yes    Education Details  form/technique with exercise    Person(s) Educated  Patient    Methods  Explanation;Demonstration    Comprehension  Verbalized understanding;Returned demonstration          PT Long Term Goals - 03/06/17 1527      PT LONG TERM GOAL #1   Title  Patient will be independent with HEP to continue benefits of therapy after discharge.    Baseline  Dependent for exercise performance and progression; moderate cueing for form/technique     Time  8    Period  Weeks    Status  On-going      PT LONG TERM GOAL #2   Title  Patient will improve TUG to under 9 sec to demonstrate significant improvement in balance and decrease in fall risk    Baseline  13sec; 12/20/16: 11sec; 01/05/17: 10 sec; 01/31/17: 9.2 sec 03/06/17: 8.6 sec  Time  8    Period  Weeks    Status  Achieved      PT LONG TERM GOAL #3   Title  Patient will improve 5XSTS to under 10sec to demonstrate significant improvement in LE strength and ability to stand up.     Baseline  16sec; 12/20/16: 13sec sit to stand; 01/05/17: 12sec; 01/31/2017: 9.8sec    Time  8    Period  Weeks    Status  Achieved      PT LONG TERM GOAL #4   Title  Patient will improve 10mWT to >177m/s to demonstrate significant improvement in walking ability and greater safety with community ambulation    Baseline  .462m/s; 12/20/16: 1.11 m/s     Time  8    Period  Weeks    Status  Achieved      PT LONG TERM GOAL #5   Title  Patient will improve DGI to 23/24 or over to indicate singificant improvement in gait mechanics and improvement in fall risk.    Baseline  20/24    Time  6    Period  Weeks    Status  New    Target Date  04/17/16            Plan - 03/22/17 1516    Clinical Impression Statement  Patient demonstrates improvement with exercises  requiring less rest breaks compared to previous sessions indicating functional improvement and carryover. Although patient is improving, he continues to demosntrate decreased LE strength and baalnce most notably when ambulating and patient will benefit from further skilled therapy to return to prior level of function.     Rehab Potential  Good    Clinical Impairments Affecting Rehab Potential  (-) Age (+) highly motivated    PT Frequency  2x / week    PT Duration  8 weeks    PT Treatment/Interventions  Therapeutic activities;Therapeutic exercise;Balance training;Iontophoresis 4mg /ml Dexamethasone;Aquatic Therapy;Electrical Stimulation;Cryotherapy;Ultrasound;Neuromuscular re-education;Patient/family education;Gait training;Stair training;Manual techniques    PT Next Visit Plan  Progress strengthening and balance exercises    Consulted and Agree with Plan of Care  Patient       Patient will benefit from skilled therapeutic intervention in order to improve the following deficits and impairments:  Abnormal gait, Decreased endurance, Decreased range of motion, Decreased strength, Decreased balance, Pain, Decreased coordination, Decreased mobility, Difficulty walking  Visit Diagnosis: Muscle weakness (generalized)  Difficulty in walking, not elsewhere classified     Problem List Patient Active Problem List   Diagnosis Date Noted  . Spondylolisthesis, grade 2 12/26/2014  . Spondylisthesis 12/26/2014    Myrene GalasWesley Timera Windt, PT DPT 03/22/2017, 3:19 PM  [facility] [address] [place], [place], [address] Phone: [phone]   Fax:  [phone]  Name: Isaiah Rogers MRN: [id number] Date of Birth: [birth year]

## 2017-03-27 ENCOUNTER — Ambulatory Visit: Payer: Medicare Other

## 2017-03-27 DIAGNOSIS — M6281 Muscle weakness (generalized): Secondary | ICD-10-CM | POA: Diagnosis not present

## 2017-03-27 DIAGNOSIS — R262 Difficulty in walking, not elsewhere classified: Secondary | ICD-10-CM

## 2017-03-27 NOTE — Therapy (Signed)
[facility] PHYSICAL AND SPORTS MEDICINE [address], [place], [address] Phone: [phone]   Fax:  [phone]  Physical Therapy Evaluation  Patient Details  Name: Isaiah Rogers MRN: [id number] Date of Birth: 06-Apr-1946 Referring Provider: Barbette ReichmannVishwanath Hande MD   Encounter Date: 03/27/2017  PT End of Session - 03/27/17 1504    Visit Number  34    Number of Visits  41    Date for PT Re-Evaluation  04/03/17    Authorization Type  5 / 10 G Code    PT Start Time  1430    PT Stop Time  1515    PT Time Calculation (min)  45 min    Activity Tolerance  Patient tolerated treatment well;No increased pain    Behavior During Therapy  WFL for tasks assessed/performed       Past Medical History:  Diagnosis Date  . Arthritis    lumbar spondylosis  . Diabetes mellitus without complication (HCC)    told that he has diabetes but not treated   . Hypertension   . Stroke [facility](HCC)     History reviewed. No pertinent surgical history.  There were no vitals filed for this visit.   Subjective Assessment - 03/27/17 1453    Subjective  Patient reports no major changes since the previous visit. Patient states he progressing.    Pertinent History  Patient has a history of CVA 5 years ago, lumbar fusion L4-5 2 years ago, patient has a hx of diabetes,    Limitations  Standing;Lifting    How long can you walk comfortably?  15min    Currently in Pain?  No/denies    Pain Onset  More than a month ago      Objective measurements completed on examination: See above findings.     TREATMENT: Therapeutic Exercise: Leg Press - Quadriceps - 2 x 20 115#; Plantarflexors - 2 x 20 115# Marching on large dynadisc - x20  Hip abduction at hip machine - 2 x 30# #55 Ambulation forward/backward in plantarflexion - 3 x 430ft Single leg heel raises off of step - 2 x 20 Ambulation forward on airex beam - x10 down and back (~106ft)   Patient demonstrates increased fatigue  at end of session   PT Education - 03/27/17 1503    Education provided  Yes    Education Details  form/technique with exercise    Person(s) Educated  Patient    Methods  Explanation;Demonstration    Comprehension  Verbalized understanding;Returned demonstration          PT Long Term Goals - 03/06/17 1527      PT LONG TERM GOAL #1   Title  Patient will be independent with HEP to continue benefits of therapy after discharge.    Baseline  Dependent for exercise performance and progression; moderate cueing for form/technique     Time  8    Period  Weeks    Status  On-going      PT LONG TERM GOAL #2   Title  Patient will improve TUG to under 9 sec to demonstrate significant improvement in balance and decrease in fall risk    Baseline  13sec; 12/20/16: 11sec; 01/05/17: 10 sec; 01/31/17: 9.2 sec 03/06/17: 8.6 sec    Time  8    Period  Weeks    Status  Achieved      PT LONG TERM GOAL #3   Title  Patient will improve 5XSTS to under 10sec  to demonstrate significant improvement in LE strength and ability to stand up.     Baseline  16sec; 12/20/16: 13sec sit to stand; 01/05/17: 12sec; 01/31/2017: 9.8sec    Time  8    Period  Weeks    Status  Achieved      PT LONG TERM GOAL #4   Title  Patient will improve to >95m/s to demonstrate significant improvement in walking ability and greater safety with community ambulation    Baseline  .42m/s; 12/20/16: 1.11 m/s     Time  8    Period  Weeks    Status  Achieved      PT LONG TERM GOAL #5   Title  Patient will improve DGI to 23/24 or over to indicate singificant improvement in gait mechanics and improvement in fall risk.    Baseline  20/24    Time  6    Period  Weeks    Status  New    Target Date  04/17/16             Plan - 03/27/17 1504    Clinical Impression Statement  Patient demonstrates improvement with tandem ambulation with less use of UE to maintain balance. Although patient is improving, he continues to demonstrates  decreased dynamic balance most noticably on compliant surfaces and will benefit from further skilled therapy focused on improving limitation to return to prior level of function.     Rehab Potential  Good    Clinical Impairments Affecting Rehab Potential  (-) Age (+) highly motivated    PT Frequency  2x / week    PT Duration  8 weeks    PT Treatment/Interventions  Therapeutic activities;Therapeutic exercise;Balance training;Iontophoresis /ml Dexamethasone;Aquatic Therapy;Electrical Stimulation;Cryotherapy;Ultrasound;Neuromuscular re-education;Patient/family education;Gait training;Stair training;Manual techniques    PT Next Visit Plan  Progress strengthening and balance exercises    Consulted and Agree with Plan of Care  Patient       Patient will benefit from skilled therapeutic intervention in order to improve the following deficits and impairments:  Abnormal gait, Decreased endurance, Decreased range of motion, Decreased strength, Decreased balance, Pain, Decreased coordination, Decreased mobility, Difficulty walking  Visit Diagnosis: Muscle weakness (generalized)  Difficulty in walking, not elsewhere classified     Problem List Patient Active Problem List   Diagnosis Date Noted  . Spondylolisthesis, grade 2 12/26/2014  . Spondylisthesis 12/26/2014    [name], PT DPT 03/27/2017, 3:10 PM  [place] [facility] PHYSICAL AND SPORTS MEDICINE [address], [place], [address] Phone: [phone]   Fax:  [phone]  Name: Isaiah Rogers MRN: [id number] Date of Birth: 1946-03-17

## 2017-03-29 ENCOUNTER — Ambulatory Visit: Payer: Medicare Other

## 2017-03-29 DIAGNOSIS — M6281 Muscle weakness (generalized): Secondary | ICD-10-CM

## 2017-03-29 DIAGNOSIS — R262 Difficulty in walking, not elsewhere classified: Secondary | ICD-10-CM

## 2017-03-29 NOTE — Therapy (Signed)
[facility] PHYSICAL AND [facility]ORTS MEDICINE [address] [place], [place], [address] Phone: [phone]   Fax:  [phone]  Physical Therapy Treatment  Patient Details  Name: Isaiah Rogers MRN: [id number] Date of Birth: November 22, 1945 Referring Provider: Barbette ReichmannVishwanath Hande MD   Encounter Date: 03/29/2017  PT End of Session - 03/29/17 1428    Visit Number  35    Number of Visits  41    Date for PT Re-Evaluation  04/03/17    Authorization Type  6 / 10 G Code    PT Start Time  1400    PT Stop Time  1445    PT Time Calculation (min)  45 min    Activity Tolerance  Patient tolerated treatment well;No increased pain    Behavior During Therapy  WFL for tasks assessed/performed       Past Medical History:  Diagnosis Date  . Arthritis    lumbar spondylosis  . Diabetes mellitus without complication (HCC)    told that he has diabetes but not treated   . Hypertension   . Stroke [facility](HCC)     History reviewed. No pertinent surgical history.  There were no vitals filed for this visit.  Subjective Assessment - 03/29/17 1408    Subjective  Patient reports no major changes since the previous visits. Patient reports he is doing "good" today.     Pertinent History  Patient has a history of CVA 5 years ago, lumbar fusion L4-5 2 years ago, patient has a hx of diabetes,    Limitations  Standing;Lifting    How long can you walk comfortably?  15min    Currently in Pain?  No/denies    Pain Onset  More than a month ago       TREATMENT: Therapeutic Exercise: Single leg heel raises off of step - 2 x 20 Leg Press - Quadriceps - 2 x 20 125#; Plantarflexors - 2 x 20 125# Running Man on the airex - 2x 15 Amb forward EC - 4 x 6730ft  Amb backward EO, EC - 2 x 4230ft each way Ambulation forward on airex beam - x10 down and back (~636ft) Ambulation forward in plantarflexion - 4 x 6630ft   Patient demonstrates increased fatigue at end of session   PT Education - 03/29/17  1424    Education provided  Yes    Education Details  form/technique with exercise    Person(s) Educated  Patient    Methods  Explanation;Demonstration    Comprehension  Verbalized understanding;Returned demonstration          PT Long Term Goals - 03/06/17 1527      PT LONG TERM GOAL #1   Title  Patient will be independent with HEP to continue benefits of therapy after discharge.    Baseline  Dependent for exercise performance and progression; moderate cueing for form/technique     Time  8    Period  Weeks    Status  On-going      PT LONG TERM GOAL #2   Title  Patient will improve TUG to under 9 sec to demonstrate significant improvement in balance and decrease in fall risk    Baseline  13sec; 12/20/16: 11sec; 01/05/17: 10 sec; 01/31/17: 9.2 sec 03/06/17: 8.6 sec    Time  8    Period  Weeks    Status  Achieved      PT LONG TERM GOAL #3   Title  Patient will improve 5XSTS to under  10sec to demonstrate significant improvement in LE strength and ability to stand up.     Baseline  16sec; 12/20/16: 13sec sit to stand; 01/05/17: 12sec; 01/31/2017: 9.8sec    Time  8    Period  Weeks    Status  Achieved      PT LONG TERM GOAL #4   Title  Patient will improve 10mWT to >2727m/s to demonstrate significant improvement in walking ability and greater safety with community ambulation    Baseline  .8180m/s; 12/20/16: 1.11 m/s     Time  8    Period  Weeks    Status  Achieved      PT LONG TERM GOAL #5   Title  Patient will improve DGI to 23/24 or over to indicate singificant improvement in gait mechanics and improvement in fall risk.    Baseline  20/24    Time  6    Period  Weeks    Status  New    Target Date  04/17/16            Plan - 03/29/17 1439    Clinical Impression Statement  Continued to perform exercises focused on improving dynamic and static balance as patient demonstrates difficulty with single leg and narrow base of support. Patient reports he feel he is improving and  getting stronger. Patient demonstrates less steps taken outside of BOS to maintain balance and will benefit from further skilled therapy to return to prior level of function.     Rehab Potential  Good    Clinical Impairments Affecting Rehab Potential  (-) Age (+) highly motivated    PT Frequency  2x / week    PT Duration  8 weeks    PT Treatment/Interventions  Therapeutic activities;Therapeutic exercise;Balance training;Iontophoresis 4mg /ml Dexamethasone;Aquatic Therapy;Electrical Stimulation;Cryotherapy;Ultrasound;Neuromuscular re-education;Patient/family education;Gait training;Stair training;Manual techniques    PT Next Visit Plan  Progress strengthening and balance exercises    Consulted and Agree with Plan of Care  Patient       Patient will benefit from skilled therapeutic intervention in order to improve the following deficits and impairments:  Abnormal gait, Decreased endurance, Decreased range of motion, Decreased strength, Decreased balance, Pain, Decreased coordination, Decreased mobility, Difficulty walking  Visit Diagnosis: Muscle weakness (generalized)  Difficulty in walking, not elsewhere classified     Problem List Patient Active Problem List   Diagnosis Date Noted  . Spondylolisthesis, grade 2 12/26/2014  . Spondylisthesis 12/26/2014    Myrene GalasWesley Jennfier Abdulla, PT DPT 03/29/2017, 2:54 PM  [facility]TS [facility]ICINE [address] [place], [place], [address] Phone: [phone]   Fax:  [phone]  Name: Isaiah Rogers MRN: [id number] Date of Birth: 05-Mar-1946

## 2021-07-22 ENCOUNTER — Observation Stay
Admission: EM | Admit: 2021-07-22 | Discharge: 2021-07-23 | Disposition: A | Discharge status: Home Health | Payer: Medicare Other | Attending: Internal Medicine | Admitting: Internal Medicine

## 2021-07-22 ENCOUNTER — Other Ambulatory Visit: Payer: Self-pay

## 2021-07-22 ENCOUNTER — Emergency Department: Payer: Medicare Other

## 2021-07-22 DIAGNOSIS — Z79899 Other long term (current) drug therapy: Secondary | ICD-10-CM | POA: Insufficient documentation

## 2021-07-22 DIAGNOSIS — I10 Essential (primary) hypertension: Secondary | ICD-10-CM | POA: Diagnosis not present

## 2021-07-22 DIAGNOSIS — Z87891 Personal history of nicotine dependence: Secondary | ICD-10-CM | POA: Diagnosis not present

## 2021-07-22 DIAGNOSIS — R200 Anesthesia of skin: Secondary | ICD-10-CM | POA: Diagnosis present

## 2021-07-22 DIAGNOSIS — Z23 Encounter for immunization: Secondary | ICD-10-CM | POA: Insufficient documentation

## 2021-07-22 DIAGNOSIS — E119 Type 2 diabetes mellitus without complications: Secondary | ICD-10-CM | POA: Insufficient documentation

## 2021-07-22 DIAGNOSIS — Z8673 Personal history of transient ischemic attack (TIA), and cerebral infarction without residual deficits: Secondary | ICD-10-CM | POA: Insufficient documentation

## 2021-07-22 DIAGNOSIS — I639 Cerebral infarction, unspecified: Principal | ICD-10-CM | POA: Diagnosis present

## 2021-07-22 LAB — URINE DRUG SCREEN, QUALITATIVE (ARMC ONLY)
Amphetamines, Ur Screen: NOT DETECTED
Barbiturates, Ur Screen: NOT DETECTED
Benzodiazepine, Ur Scrn: NOT DETECTED
Cannabinoid 50 Ng, Ur ~~LOC~~: NOT DETECTED
Cocaine Metabolite,Ur ~~LOC~~: NOT DETECTED
MDMA (Ecstasy)Ur Screen: NOT DETECTED
Methadone Scn, Ur: NOT DETECTED
Opiate, Ur Screen: NOT DETECTED
Phencyclidine (PCP) Ur S: NOT DETECTED
Tricyclic, Ur Screen: NOT DETECTED

## 2021-07-22 LAB — CBC WITH DIFFERENTIAL/PLATELET
Abs Immature Granulocytes: 0.01 10*3/uL (ref 0.00–0.07)
Basophils Absolute: 0 10*3/uL (ref 0.0–0.1)
Basophils Relative: 1 %
Eosinophils Absolute: 0.1 10*3/uL (ref 0.0–0.5)
Eosinophils Relative: 1 %
HCT: 40.4 % (ref 39.0–52.0)
Hemoglobin: 13.2 g/dL (ref 13.0–17.0)
Immature Granulocytes: 0 %
Lymphocytes Relative: 21 %
Lymphs Abs: 1.2 10*3/uL (ref 0.7–4.0)
MCH: 27.6 pg (ref 26.0–34.0)
MCHC: 32.7 g/dL (ref 30.0–36.0)
MCV: 84.3 fL (ref 80.0–100.0)
Monocytes Absolute: 0.4 10*3/uL (ref 0.1–1.0)
Monocytes Relative: 8 %
Neutro Abs: 4 10*3/uL (ref 1.7–7.7)
Neutrophils Relative %: 69 %
Platelets: 259 10*3/uL (ref 150–400)
RBC: 4.79 MIL/uL (ref 4.22–5.81)
RDW: 12.8 % (ref 11.5–15.5)
WBC: 5.7 10*3/uL (ref 4.0–10.5)
nRBC: 0 % (ref 0.0–0.2)

## 2021-07-22 LAB — COMPREHENSIVE METABOLIC PANEL
ALT: 15 U/L (ref 0–44)
AST: 20 U/L (ref 15–41)
Albumin: 3.6 g/dL (ref 3.5–5.0)
Alkaline Phosphatase: 86 U/L (ref 38–126)
Anion gap: 8 (ref 5–15)
BUN: 23 mg/dL (ref 8–23)
CO2: 24 mmol/L (ref 22–32)
Calcium: 8.9 mg/dL (ref 8.9–10.3)
Chloride: 107 mmol/L (ref 98–111)
Creatinine, Ser: 1.08 mg/dL (ref 0.61–1.24)
GFR, Estimated: >60 mL/min (ref >60)
Glucose, Bld: 102 mg/dL — ABNORMAL HIGH (ref 70–99)
Potassium: 3.8 mmol/L (ref 3.5–5.1)
Sodium: 139 mmol/L (ref 135–145)
Total Bilirubin: 0.6 mg/dL (ref 0.3–1.2)
Total Protein: 7.3 g/dL (ref 6.5–8.1)

## 2021-07-22 LAB — URINALYSIS, ROUTINE W REFLEX MICROSCOPIC
Bilirubin Urine: NEGATIVE
Glucose, UA: NEGATIVE mg/dL
Hgb urine dipstick: NEGATIVE
Ketones, ur: NEGATIVE mg/dL
Leukocytes,Ua: NEGATIVE
Nitrite: NEGATIVE
Protein, ur: NEGATIVE mg/dL
Specific Gravity, Urine: 1.005 (ref 1.005–1.030)
pH: 7 (ref 5.0–8.0)

## 2021-07-22 MED ORDER — ACETAMINOPHEN 325 MG PO TABS
650.0000 mg | ORAL_TABLET | ORAL | Status: DC | PRN
Start: 2021-07-22 — End: 2021-07-23

## 2021-07-22 MED ORDER — PROCHLORPERAZINE MALEATE 10 MG PO TABS
10.0000 mg | ORAL_TABLET | Freq: Once | ORAL | Status: AC
  Administered 2021-07-22: 10 mg via ORAL
  Filled 2021-07-22: qty 1

## 2021-07-22 MED ORDER — ATORVASTATIN CALCIUM 20 MG PO TABS
40.0000 mg | ORAL_TABLET | Freq: Every day | ORAL | Status: DC
  Administered 2021-07-22 – 2021-07-23 (×2): 40 mg via ORAL
  Filled 2021-07-22 (×2): qty 2

## 2021-07-22 MED ORDER — LABETALOL HCL 5 MG/ML IV SOLN: 10.0000 mg | Freq: Four times a day (QID) | INTRAVENOUS | Status: DC | PRN

## 2021-07-22 MED ORDER — ROPINIROLE HCL 1 MG PO TABS
0.5000 mg | ORAL_TABLET | Freq: Every day | ORAL | Status: DC
  Administered 2021-07-22: 0.5 mg via ORAL
  Filled 2021-07-22: qty 1

## 2021-07-22 MED ORDER — PNEUMOCOCCAL 20-VAL CONJ VACC 0.5 ML IM SUSY
0.5000 mL | PREFILLED_SYRINGE | INTRAMUSCULAR | Status: AC
Start: 2021-07-23 — End: 2021-07-23
  Administered 2021-07-23: 0.5 mL via INTRAMUSCULAR
  Filled 2021-07-22 (×2): qty 0.5

## 2021-07-22 MED ORDER — ACETAMINOPHEN 160 MG/5ML PO SOLN
650.0000 mg | ORAL | Status: DC | PRN
  Filled 2021-07-22: qty 20.3

## 2021-07-22 MED ORDER — ACETAMINOPHEN 500 MG PO TABS
1000.0000 mg | ORAL_TABLET | Freq: Once | ORAL | Status: AC
  Administered 2021-07-22: 1000 mg via ORAL
  Filled 2021-07-22: qty 2

## 2021-07-22 MED ORDER — ASPIRIN 81 MG PO CHEW
81.0000 mg | CHEWABLE_TABLET | Freq: Every day | ORAL | Status: DC
  Administered 2021-07-22 – 2021-07-23 (×2): 81 mg via ORAL
  Filled 2021-07-22 (×2): qty 1

## 2021-07-22 MED ORDER — GABAPENTIN 300 MG PO CAPS
300.0000 mg | ORAL_CAPSULE | Freq: Three times a day (TID) | ORAL | Status: DC
  Administered 2021-07-22 – 2021-07-23 (×3): 300 mg via ORAL
  Filled 2021-07-22 (×3): qty 1

## 2021-07-22 MED ORDER — ACETAMINOPHEN 650 MG RE SUPP: 650.0000 mg | RECTAL | Status: DC | PRN

## 2021-07-22 MED ORDER — STROKE: EARLY STAGES OF RECOVERY BOOK: Freq: Once | Status: AC

## 2021-07-22 MED ORDER — ORAL CARE MOUTH RINSE
15.0000 mL | Freq: Two times a day (BID) | OROMUCOSAL | Status: DC
  Administered 2021-07-23: 15 mL via OROMUCOSAL

## 2021-07-22 MED ORDER — CLOPIDOGREL BISULFATE 75 MG PO TABS
75.0000 mg | ORAL_TABLET | Freq: Every day | ORAL | Status: DC
  Administered 2021-07-22 – 2021-07-23 (×2): 75 mg via ORAL
  Filled 2021-07-22 (×2): qty 1

## 2021-07-22 NOTE — ED Notes (Signed)
With in-person interpreter at bedside.  ? ?Pt presents to ED with c/o of having L sided head and face numbness. Pt states HX of stroke 6 years ago. Pt states s/s started this past Monday. Pt states he is unsure of medical HX and the meds he takes and why and when he takes them. ? ?Pt denies any new illness to this RN. Pt states his sensation to his L cheek does feel different from the R side when this RN poked him with a  sharp end of a q-tip applicator, pt does also endorse some numbness to L hand but states the sensation feeling is the same. Pt has no facial droop and is speaking clear at this time. Pt has equal bilateral grip and strengths. Pt denies any one sided weakness to this RN.  ?

## 2021-07-22 NOTE — ED Triage Notes (Signed)
Pt c/o left face and arm numbness/pain since Sunday or Monday, pt is unsure.. denies any difficulty with movement, no facial droop.. pt has clear speech.  ?

## 2021-07-22 NOTE — ED Triage Notes (Signed)
First RN note: ? ?Pt comes into the ED via [facility] clinic where he presented over there with numbness on one side of his head.  Pt states symptoms started Sunday or Monday.   ?

## 2021-07-22 NOTE — Assessment & Plan Note (Signed)
Patient has diet-controlled diabetes mellitus ?Maintain consistent carbohydrate diet ?Blood sugar checks with meals ?Check hemoglobin A1c level ?

## 2021-07-22 NOTE — Assessment & Plan Note (Signed)
Patient presents for evaluation of left facial paresthesias and numbness for several days. ?Initial CT scan of the head without contrast was negative for bleed but MRI shows small acute right thalamocapsular infarct. Slight edema without mass effect. Moderate chronic microvascular ischemic disease. Multiple chronic microhemorrhages, detailed above and probably hypertensive in etiology. ?Place patient on low-dose aspirin and high intensity statins. ?We will request PT/ST/OT consult ?Obtain 2D echocardiogram to assess for cardiac thrombus ?We will request neurology consult ?Allow for permissive hypertension ?

## 2021-07-22 NOTE — H&P (Signed)
?History and Physical  ? ? ?Patient: Isaiah Rogers LNL:[id number] DOB: 02-19-1946 ?DOA: 07/22/2021 ?DOS: the patient was seen and examined on 07/22/2021 ?PCP: [facility]  ?Patient coming from: Home ? ?Chief Complaint:  ?Chief Complaint  ?Patient presents with  ? Weakness  ? ?Most of the history was obtained using the mobile interpreter ?HPI: Isaiah Rogers is a 76 y.o. male with medical history significant for diabetes mellitus, hypertension, history of CVA with no residual deficits who presents to the ER from [facility] where he went for evaluation of left sided numbness involving the left side of his head and face.  He has had symptoms for about 4 to 5 days. ?He decided to follow-up with his primary care provider due to persistence of his symptoms. ?He denies having any focal deficits, no fall, no double vision, no difficulty swallowing, no headache, no blurred vision. ?He has had no chest pain, no shortness of breath, no nausea, no vomiting, no abdominal pain, no urinary symptoms or any changes in his bowel habits. ?CT scan of the head without contrast shows no evidence of acute intracranial abnormality. MRI could provide more sensitive evaluation for acute infarct if clinically warranted. ?Patient had an MRI of the brain which showed remote lacunar infarcts and chronic microvascular ischemic disease. Small acute right thalamocapsular infarct. Slight edema without mass effect. Moderate chronic microvascular ischemic disease. ?Multiple chronic microhemorrhages, detailed above and probably hypertensive in etiology. ?Review of Systems: As mentioned in the history of present illness. All other systems reviewed and are negative. ?Past Medical History:  ?Diagnosis Date  ? Arthritis   ? lumbar spondylosis  ? Diabetes mellitus without complication (HCC)   ? told that he has diabetes but not treated   ? Hypertension   ? Stroke [facility])   ? ?History reviewed. No pertinent surgical history. ?Social History:   reports that he has quit smoking. His smoking use included cigarettes. He quit smokeless tobacco use about 30 years ago. He reports that he does not drink alcohol and does not use drugs. ? ?No Known Allergies ? ?No family history on file. ? ?Prior to Admission medications   ?Medication Sig Start Date End Date Taking? Authorizing Provider  ?Cyanocobalamin (VITAMIN B12 PO) Take 1 tablet by mouth at bedtime.    [provider]  ?diazepam (VALIUM) 5 MG tablet Take 1 tablet (5 mg total) by mouth every 6 (six) hours as needed for muscle spasms. ?Patient not taking: Reported on 11/14/2016 12/29/14   Lisbeth Renshaw, MD  ?ferrous sulfate 325 (65 FE) MG tablet Take 325 mg by mouth daily with breakfast.    [provider]  ?gabapentin (NEURONTIN) 300 MG capsule Take 300 mg by mouth 3 (three) times daily.     [provider]  ?Liniments Chinita Pester) PADS Apply 1 each topically daily as needed (back pain).    [provider]  ?lisinopril (PRINIVIL,ZESTRIL) 20 MG tablet Take 20 mg by mouth daily.    [provider]  ?meloxicam (MOBIC) 7.5 MG tablet Take 7.5 mg by mouth daily.    [provider]  ?oxyCODONE-acetaminophen (PERCOCET/ROXICET) 5-325 MG per tablet Take 1-2 tablets by mouth every 4 (four) hours as needed for moderate pain. ?Patient not taking: Reported on 11/14/2016 12/29/14   Lisbeth Renshaw, MD  ?pravastatin (PRAVACHOL) 20 MG tablet Take 20 mg by mouth at bedtime.     [provider]  ?rOPINIRole (REQUIP) 0.5 MG tablet Take 0.5 mg by mouth at bedtime.    [provider]  ? ? ?Physical Exam: ?Vitals:  ? 07/22/21 1108 07/22/21 1109 07/22/21 1455 07/22/21 1500  ?BP: (!) 165/86  (!) 149/86 (!) 186/89  ?Pulse: 63  (!) 51 (!) 57  ?Resp: 17  18 17   ?Temp: 97.7 ?F (36.5 ?C)     ?TempSrc: Oral     ?SpO2: 98%  99% 97%  ?Weight:  63.5 kg    ? ?Physical Exam ?Vitals and nursing note reviewed.  ?Constitutional:   ?   Appearance: Normal appearance.  ?HENT:  ?    Head: Normocephalic and atraumatic.  ?   Nose: Nose normal.  ?   Mouth/Throat:  ?   Mouth: Mucous membranes are moist.  ?Eyes:  ?   Pupils: Pupils are equal, round, and reactive to light.  ?Cardiovascular:  ?   Rate and Rhythm: Normal rate and regular rhythm.  ?Pulmonary:  ?   Effort: Pulmonary effort is normal.  ?Abdominal:  ?   General: Abdomen is flat. Bowel sounds are normal.  ?   Palpations: Abdomen is soft.  ?Musculoskeletal:     ?   General: Normal range of motion.  ?   Cervical back: Normal range of motion and neck supple.  ?Skin: ?   General: Skin is warm and dry.  ?Neurological:  ?   Mental Status: He is alert and oriented to person, place, and time.  ?   Comments: Able to move all extremities  ?Psychiatric:     ?   Mood and Affect: Mood normal.     ?   Behavior: Behavior normal.  ? ? ?Data Reviewed: ?Relevant notes from primary care and specialist visits, past discharge summaries as available in EHR, including Care Everywhere. ?Prior diagnostic testing as pertinent to current admission diagnoses ?Updated medications and problem lists for reconciliation ?ED course, including vitals, labs, imaging, treatment and response to treatment ?Triage notes, nursing and pharmacy notes and ED provider's notes ?Notable results as noted in HPI ?Labs reviewed.  Essentially negative ?Twelve-lead EKG reviewed by me shows sinus rhythm. ?There are no new results to review at this time. ? ?Assessment and Plan: ?* CVA (cerebral vascular accident) (HCC) ?Patient presents for evaluation of left facial paresthesias and numbness for several days. ?Initial CT scan of the head without contrast was negative for bleed but MRI shows small acute right thalamocapsular infarct. Slight edema without mass effect. Moderate chronic microvascular ischemic disease. Multiple chronic microhemorrhages, detailed above and probably hypertensive in etiology. ?Place patient on low-dose aspirin and high intensity statins. ?We will request PT/ST/OT  consult ?Obtain 2D echocardiogram to assess for cardiac thrombus ?We will request neurology consult ?Allow for permissive hypertension ? ?Hypertension ?Allow for permissive hypertension due to acute stroke ?Hold lisinopril for now ? ?Diabetes mellitus without complication (HCC) ?Patient has diet-controlled diabetes mellitus ?Maintain consistent carbohydrate diet ?Blood sugar checks with meals ?Check hemoglobin A1c level ? ? ? ? ? Advance Care Planning:   Code Status: Full Code  ? ?Consults: Neurology ? ?Family Communication: Greater than 50% of time was spent discussing patient's condition and plan of care with him at the bedside.  All questions and concerns have been addressed.  He verbalized understanding and agrees with the plan. ? ?Severity of Illness: ?The appropriate patient status for this patient is OBSERVATION. Observation status is judged to be reasonable and necessary in order to provide the required intensity of service to ensure the patient's safety. The patient's presenting symptoms, physical exam findings, and initial radiographic and laboratory data in  the context of their medical condition is felt to place them at decreased risk for further clinical deterioration. Furthermore, it is anticipated that the patient will be medically stable for discharge from the hospital within 2 midnights of admission.  ? ?Author: ?Lucile Shutters, MD ?07/22/2021 3:59 PM ? ?For on call review [email or web address].  ?

## 2021-07-22 NOTE — Progress Notes (Signed)
SLP Cancellation Note ? ?Patient Details ?Name: Isaiah Rogers ?MRN: [id number] ?DOB: 06/26/1945 ? ? ?Cancelled treatment:       Reason Eval/Treat Not Completed: SLP screened, no needs identified, will sign off (chrt reviewed; consulted NSG then met w/ pt w/ assistance of Spanish Isaiah Rogers [phone]) ?Via the Interpreter, pt denied any difficulty swallowing and is currently on a regular diet; tolerates swallowing pills w/ water per NSG. Post giving setup support, pt began eating his dinner meal while during visit w/out deficits noted. Pt conversed in conversation w/out expressive/receptive deficits noted via engagement and responses to the Interpreter; pt was A/O x3. He denied any speech-language deficits. Speech adequtely intelligible to the Interpreter. ?No further skilled ST services indicated as pt appears at his baseline. Pt agreed. NSG to reconsult if any change in status while admitted.   ? ? ? ? ?Orinda Kenner, MS, CCC-SLP ?Speech Language Pathologist ?Re[facility]; [place] ?[phone] (ascom) ?Isaiah Rogers ?07/22/2021, 6:17 PM ?

## 2021-07-22 NOTE — ED Provider Notes (Signed)
? ?[facility] ?Provider Note ? ? ? Event Date/Time  ? First MD Initiated Contact with Patient 07/22/21 1122   ?  (approximate) ? ? ?History  ? ?Weakness ? ? ?HPI ? ?Amer Alcindor is a 76 y.o. male who presents to the ED for evaluation of Weakness ?  ?I reviewed PCP visit from 11/18.  History of HTN, BPH, HLD.  History of CVA. ? ?Patient presents to the ED for evaluation of left-sided facial paresthesia.  He reports symptoms constantly since Monday with left side of his face and head feeling different than the right. ? ?Denies any other symptoms.  Reports chronic and remote left sided extremity paresthesias from a stroke 6 years ago.  These are at baseline. ? ?Denies any fever, headache, emesis, diarrhea.  Denies difficulty phonating, swallowing, chewing.  Denies changes to his vision.  Denies syncope or injury. ? ? ?Physical Exam  ? ?Triage Vital Signs: ?ED Triage Vitals  ?Enc Vitals Group  ?   BP 07/22/21 1108 (!) 165/86  ?   Pulse Rate 07/22/21 1108 63  ?   Resp 07/22/21 1108 17  ?   Temp 07/22/21 1108 97.7 ?F (36.5 ?C)  ?   Temp Source 07/22/21 1108 Oral  ?   SpO2 07/22/21 1108 98 %  ?   Weight 07/22/21 1109 140 lb (63.5 kg)  ?   Height --   ?   Head Circumference --   ?   Peak Flow --   ?   Pain Score 07/22/21 1109 0  ?   Pain Loc --   ?   Pain Edu? --   ?   Excl. in GC? --   ? ? ?Most recent vital signs: ?Vitals:  ? 07/22/21 1108  ?BP: (!) 165/86  ?Pulse: 63  ?Resp: 17  ?Temp: 97.7 ?F (36.5 ?C)  ?SpO2: 98%  ? ? ?General: Awake, no distress.  ?CV:  Good peripheral perfusion.  ?Resp:  Normal effort.  ?Abd:  No distention.  ?MSK:  No deformity noted.  ?Neuro:  Sensorium asymmetry to the bilateral face, diminished on the left.  Smile symmetric and otherwise cranial nerves appear intact. ?5/5 strength and sensation in all 4 extremities ?Other:   ? ? ?ED Results / Procedures / Treatments  ? ?Labs ?(all labs ordered are listed, but only abnormal results are displayed) ?Labs Reviewed   ?COMPREHENSIVE METABOLIC PANEL - Abnormal; Notable for the following components:  ?    Result Value  ? Glucose, Bld 102 (*)   ? All other components within normal limits  ?URINALYSIS, ROUTINE W REFLEX MICROSCOPIC - Abnormal; Notable for the following components:  ? Color, Urine STRAW (*)   ? APPearance CLEAR (*)   ? All other components within normal limits  ?CBC WITH DIFFERENTIAL/PLATELET  ?URINE DRUG SCREEN, QUALITATIVE ([facility] ONLY)  ? ? ?EKG ?Sinus rhythm, rate of 59 bpm.  Normal axis and intervals.  No STEMI.  Nonspecific ST changes to leads III and aVF ? ?RADIOLOGY ?CT head reviewed by me without evidence of acute intracranial pathology ? ? ?Official radiology report(s): ?CT HEAD WO CONTRAST ( ) ? ?Result Date: 07/22/2021 ?CLINICAL DATA:  left sided numbness EXAM: CT HEAD WITHOUT CONTRAST TECHNIQUE: Contiguous axial images were obtained from the base of the skull through the vertex without intravenous contrast. RADIATION DOSE REDUCTION: This exam was performed according to the departmental dose-optimization program which includes automated exposure control, adjustment of the mA and/or kV according to patient size and/or use  of iterative reconstruction technique. COMPARISON:  July 15, 2012. FINDINGS: Brain: No evidence of acute large vascular territory infarction, acute hemorrhage, hydrocephalus, extra-axial collection or mass lesion/mass effect. Remote lacunar infarcts bilateral craniotomy. Additional patchy white matter hypoattenuation, nonspecific but of chronic microvascular ischemic disease. Vascular: No hyperdense vessel identified. Skull: No acute fracture. Sinuses/Orbits: Clear visualized sinuses. No acute findings in the visualized orbits. Other: No mastoid effusions. IMPRESSION: 1. No evidence of acute intracranial abnormality. MRI could provide more sensitive evaluation for acute infarct if clinically warranted. 2. Remote lacunar infarcts and chronic microvascular ischemic disease. Electronically  Signed   By: Feliberto HartsFrederick S Jones M.D.   On: 07/22/2021 12:06  ? ?MR BRAIN WO CONTRAST ? ?Result Date: 07/22/2021 ?CLINICAL DATA:  hx cva. new left facial numbness, dizziness. eval cva EXAM: MRI HEAD WITHOUT CONTRAST TECHNIQUE: Multiplanar, multiecho pulse sequences of the brain and surrounding structures were obtained without intravenous contrast. COMPARISON:  Same day CT head. FINDINGS: Brain: Small acute right thalamocapsular infarct. Slight edema without mass effect. Additional moderate patchy T2/FLAIR hyperintensities within the white matter, nonspecific but compatible with chronic microvascular ischemic disease. No evidence of acute hemorrhage, mass lesion, midline shift, hydrocephalus. Cerebral atrophy with ex vacuo ventricular dilation. Multiple foci of susceptibility artifact within bilateral thalami, basal ganglia, and pons, probably related to chronic hypertensive microhemorrhages. A few additional small foci of chronic microhemorrhage in the right parietal lobe. Remote infarcts in the corona radiata. Vascular: Major arterial flow voids are maintained at the skull base. Skull and upper cervical spine: Normal marrow signal. Sinuses/Orbits: Clear sinuses.  No acute orbital findings. Other: No mastoid effusions. IMPRESSION: 1. Small acute right thalamocapsular infarct. Slight edema without mass effect. 2. Moderate chronic microvascular ischemic disease. 3. Multiple chronic microhemorrhages, detailed above and probably hypertensive in etiology. Electronically Signed   By: Feliberto HartsFrederick S Jones M.D.   On: 07/22/2021 14:25   ? ?PROCEDURES and INTERVENTIONS: ? ?.1-3 Lead EKG Interpretation ?Performed by: Delton PrairieSmith, Amine Adelson, MD ?Authorized by: Delton PrairieSmith, Brody Kump, MD  ? ?  Interpretation: normal   ?  ECG rate:  60 ?  ECG rate assessment: normal   ?  Rhythm: sinus rhythm   ?  Ectopy: none   ?  Conduction: normal   ?.Critical Care ?Performed by: Delton PrairieSmith, Ashvik Grundman, MD ?Authorized by: Delton PrairieSmith, Kimber Esterly, MD  ? ?Critical care provider statement:  ?   Critical care time (minutes):  30 ?  Critical care time was exclusive of:  Separately billable procedures and treating other patients ?  Critical care was necessary to treat or prevent imminent or life-threatening deterioration of the following conditions:  CNS failure or compromise ?  Critical care was time spent personally by me on the following activities:  Development of treatment plan with patient or surrogate, discussions with consultants, evaluation of patient's response to treatment, examination of patient, ordering and review of laboratory studies, ordering and review of radiographic studies, ordering and performing treatments and interventions, pulse oximetry, re-evaluation of patient's condition and review of old charts ? ?Medications  ?acetaminophen (TYLENOL) tablet 1,000 mg (1,000 mg Oral Given 07/22/21 1326)  ?prochlorperazine (COMPAZINE) tablet 10 mg (10 mg Oral Given 07/22/21 1331)  ? ? ? ?IMPRESSION / MDM / ASSESSMENT AND PLAN / ED COURSE  ?I reviewed the triage vital signs and the nursing notes. ? ?76 year old male presents to the ED with evidence of an acute stroke requiring medical admission.  He has isolated acute left-sided facial sensorium changes without evidence of other acute neurologic deficits.  No signs of trauma  or any vascular deficits.  Blood work is benign with normal CBC and metabolic panel.  Urine is clear.  CT head without evidence of ICH or other intracranial pathology, but follow-up MRI obtained due to his persistent symptoms and does demonstrate evidence of acute stroke.  He is outside the window for any intervention, we will consult with medicine for admission for further stroke work-up. ? ?Clinical Course as of 07/22/21 1522  ?Thu Jul 22, 2021  ?1509 I consult with medicine, who agrees to admit [DS]  ?  ?Clinical Course User Index ?[DS] Delton Prairie, MD  ? ? ? ?FINAL CLINICAL IMPRESSION(S) / ED DIAGNOSES  ? ?Final diagnoses:  ?Cerebrovascular accident (CVA), unspecified  mechanism (HCC)  ? ? ? ?Rx / DC Orders  ? ?ED Discharge Orders   ? ? None  ? ?  ? ? ? ?Note:  This document was prepared using Dragon voice recognition software and may include unintentional dictation errors. ?  ?Katrinka Blazing,

## 2021-07-22 NOTE — ED Notes (Signed)
Secretary placing transport at this time  ?

## 2021-07-22 NOTE — Assessment & Plan Note (Signed)
Allow for permissive hypertension due to acute stroke ?Hold lisinopril for now ?

## 2021-07-22 NOTE — ED Notes (Signed)
Informed RN bed assigned 

## 2021-07-22 NOTE — ED Notes (Signed)
Pt to MRI

## 2021-07-22 NOTE — ED Notes (Signed)
Dr. Francine Graven at bedside, mobile interpreter at bedside.  ?

## 2021-07-23 ENCOUNTER — Observation Stay: Payer: Medicare Other

## 2021-07-23 ENCOUNTER — Encounter: Payer: Self-pay | Admitting: Internal Medicine

## 2021-07-23 ENCOUNTER — Observation Stay
Admit: 2021-07-23 | Discharge: 2021-07-23 | Disposition: A | Discharge status: Home / Self Care | Payer: Medicare Other | Attending: Internal Medicine | Admitting: Internal Medicine

## 2021-07-23 DIAGNOSIS — I639 Cerebral infarction, unspecified: Secondary | ICD-10-CM | POA: Diagnosis not present

## 2021-07-23 LAB — HEMOGLOBIN A1C
Hgb A1c MFr Bld: 6.2 % — ABNORMAL HIGH (ref 4.8–5.6)
Mean Plasma Glucose: 131.24 mg/dL

## 2021-07-23 LAB — ECHOCARDIOGRAM COMPLETE
AR max vel: 2.52 cm2
AV Area VTI: 2.75 cm2
AV Area mean vel: 2.44 cm2
AV Mean grad: 5 mmHg
AV Peak grad: 10 mmHg
Ao pk vel: 1.59 m/s
Area-P 1/2: 4.15 cm2
MV VTI: 4.65 cm2
S' Lateral: 2.5 cm
Weight: 2240 oz

## 2021-07-23 LAB — LIPID PANEL
Cholesterol: 163 mg/dL (ref 0–200)
HDL: 39 mg/dL — ABNORMAL LOW (ref >40)
LDL Cholesterol: 100 mg/dL — ABNORMAL HIGH (ref 0–99)
Total CHOL/HDL Ratio: 4.2 RATIO
Triglycerides: 118 mg/dL (ref <150)
VLDL: 24 mg/dL (ref 0–40)

## 2021-07-23 LAB — GLUCOSE, CAPILLARY
Glucose-Capillary: 105 mg/dL — ABNORMAL HIGH (ref 70–99)
Glucose-Capillary: 121 mg/dL — ABNORMAL HIGH (ref 70–99)

## 2021-07-23 MED ORDER — CLOPIDOGREL BISULFATE 75 MG PO TABS: 75.0000 mg | ORAL_TABLET | Freq: Every day | ORAL | 0 refills | Status: AC

## 2021-07-23 MED ORDER — IOHEXOL 350 MG/ML SOLN
75.0000 mL | Freq: Once | INTRAVENOUS | Status: AC | PRN
  Administered 2021-07-23: 75 mL via INTRAVENOUS

## 2021-07-23 MED ORDER — ATORVASTATIN CALCIUM 40 MG PO TABS: 40.0000 mg | ORAL_TABLET | Freq: Every day | ORAL | 1 refills | Status: AC

## 2021-07-23 MED ORDER — LISINOPRIL 20 MG PO TABS: 20.0000 mg | ORAL_TABLET | Freq: Every day | ORAL | 1 refills | Status: AC

## 2021-07-23 MED ORDER — ASPIRIN 81 MG PO CHEW: 81.0000 mg | CHEWABLE_TABLET | Freq: Every day | ORAL | 11 refills | Status: AC

## 2021-07-23 NOTE — Discharge Summary (Signed)
?Physician Discharge Summary ?  ?Patient: Isaiah Rogers MRN: [id number] DOB: 10/30/1945  ?Admit date:     07/22/2021  ?Discharge date: 07/23/21  ?Discharge Physician: Lorella Nimrod  ? ?PCP: [facility]  ? ?Recommendations at discharge:  ?Make sure patient started taking his blood pressure medications as it was high. ?Patient has A1c of 6.2-will need monitoring and starting antidiabetic medications for risk modification as needed. ?Patient will continue with DAPT for 21 days, please ensure that he stopped taking Plavix after that and continue with aspirin. ?Patient was also started on Lipitor ?Follow-up with primary care provider within a week ?Follow-up with neurology ? ?Discharge Diagnoses: ?Principal Problem: ?  CVA (cerebral vascular accident) ([place]) ?Active Problems: ?  Hypertension ?  Diabetes mellitus without complication ([place]) ? ? ?Hospital Course: ?Taken from H&P. ? ?Jyheim Arntzen is a 76 y.o. male with medical history significant for diabetes mellitus, hypertension, history of CVA with no residual deficits who presents to the ER from [facility] where he went for evaluation of left sided numbness involving the left side of his head and face.  He has had symptoms for about 4 to 5 days. ?He decided to follow-up with his primary care provider due to persistence of his symptoms. ?He denies having any focal deficits, no fall, no double vision, no difficulty swallowing, no headache, no blurred vision. ?He has had no chest pain, no shortness of breath, no nausea, no vomiting, no abdominal pain, no urinary symptoms or any changes in his bowel habits. ? ?On arrival to ED he was hemodynamically stable.  Pretty much unremarkable labs with A1c of 6.2, LDL of 100. ? ?Initial CT head without contrast with no evidence of acute intracranial abnormality. ? ?MRI of the brain with a small acute right thalamic capsular infarct, slight edema without mass effect ?Moderate chronic microvascular ischemic  disease. 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 ? ?CTA head and neck with 30% stenosis of right proximal ICA, and no other significant large vessel occlusion.  Please see the report as follows ?IMPRESSION: ?1. The known small acute right thalamic capsular infarct is better ?seen on the MRI dated 1 day prior. No evidence of new acute ?intracranial pathology. ?2. Mild atherosclerotic irregularity of the bilateral PCAs left M1 ?segment without proximal high-grade stenosis or occlusion. ?Otherwise, patent intracranial vasculature. ?3. 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 Otherwise, patent vasculature of the neck. ?4. 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 Correlate ?with history and consider nonemergent ENT referral as indicatedd. ? ?Echocardiogram was normal. ? ?PT OT recommended home health which were ordered. ? ?Apparently patient was not taking his home medications which include MD hypertensives.  Blood pressure elevated.  He was advised to restart his home lisinopril from tomorrow as we are allowing for permissive hypertension for 48 hours. ? ?Neurology also recommended DAPT with aspirin and Plavix for 21 days, followed by aspirin only. ?He was started on high intensity statin. ?A1c of 6.2 and he was not on any antidiabetics at home.  That makes him prediabetic.  He needs to follow a carb modified diet and discuss with primary care provider to start any medications if needed. ? ?Patient will continue with current management and will follow-up with his providers. ? ? ? ?Assessment and Plan: ?* CVA (cerebral vascular accident) ([place]) ?Patient presents for evaluation of left facial paresthesias and numbness for several days. ?Initial CT scan of the head without contrast was negative for bleed but MRI  shows small acute right thalamocapsular infarct. Slight edema without mass effect. Moderate chronic microvascular ischemic disease.  Multiple chronic microhemorrhages, detailed above and probably hypertensive in etiology. ?Place patient on low-dose aspirin and high intensity statins. ?We will request PT/ST/OT consult ?Obtain 2D echocardiogram to assess for cardiac thrombus ?We will request neurology consult ?Allow for permissive hypertension ? ?Hypertension ?Allow for permissive hypertension due to acute stroke ?Hold lisinopril for now ? ?Diabetes mellitus without complication ([place]) ?Patient has diet-controlled diabetes mellitus ?Maintain consistent carbohydrate diet ?Blood sugar checks with meals ?Check hemoglobin A1c level ? ? ?Consultants: Neurology ?Procedures performed: None ?Disposition: Home ?Diet recommendation:  ?Discharge Diet Orders (From admission, onward)  ? ?  Start     Ordered  ? 07/23/21 0000  Diet - low sodium heart healthy       ? 07/23/21 1432  ? ?  ?  ? ?  ? ?Cardiac and Carb modified diet ?DISCHARGE MEDICATION: ?Allergies as of 07/23/2021   ?No Known Allergies ?  ? ?  ?Medication List  ?  ? ?STOP taking these medications   ? ?diazepam 5 MG tablet ?Commonly known as: VALIUM ?  ?meloxicam 7.5 MG tablet ?Commonly known as: MOBIC ?  ?oxyCODONE-acetaminophen 5-325 MG tablet ?Commonly known as: PERCOCET/ROXICET ?  ?pravastatin 20 MG tablet ?Commonly known as: PRAVACHOL ?  ? ?  ? ?TAKE these medications   ? ?aspirin 81 MG chewable tablet ?Chew 1 tablet (81 mg total) by mouth daily. ?Start taking on: July 24, 2021 ?  ?atorvastatin 40 MG tablet ?Commonly known as: LIPITOR ?Take 1 tablet (40 mg total) by mouth daily. ?Start taking on: July 24, 2021 ?  ?clopidogrel 75 MG tablet ?Commonly known as: PLAVIX ?Take 1 tablet (75 mg total) by mouth daily for 21 days. ?Start taking on: July 24, 2021 ?  ?ferrous sulfate 325 (65 FE) MG tablet ?Take 325 mg by mouth daily with breakfast. ?  ?gabapentin 300 MG capsule ?Commonly known as: NEURONTIN ?Take 300 mg by mouth 3 (three) times daily. ?  ?lisinopril 20 MG tablet ?Commonly known as:  ZESTRIL ?Take 1 tablet (20 mg total) by mouth daily. ?  ?rOPINIRole 0.5 MG tablet ?Commonly known as: REQUIP ?Take 0.5 mg by mouth at bedtime. ?  ?Salonpas Pads ?Apply 1 each topically daily as needed (back pain). ?  ?VITAMIN B12 PO ?Take 1 tablet by mouth at bedtime. ?  ? ?  ? ? Follow-up Information   ? ? [facility]. Schedule an appointment as soon as possible for a visit in 1 week(s).   ?Contact information: ?[place] [address] ?[phone] ? ? ?  ?  ? ?  ?  ? ?  ? ?Discharge Exam: ?Danley Danker Weights  ? 07/22/21 1109  ?Weight: 63.5 kg  ? ?General.     In no acute distress. ?Pulmonary.  Lungs clear bilaterally, normal respiratory effort. ?CV.  Regular rate and rhythm, no JVD, rub or murmur. ?Abdomen.  Soft, nontender, nondistended, BS positive. ?CNS.  Alert and oriented .  No focal neurologic deficit. ?Extremities.  No edema, no cyanosis, pulses intact and symmetrical. ?Psychiatry.  Judgment and insight appears normal.  ? ?Condition at discharge: stable ? ?The results of significant diagnostics from this hospitalization (including imaging, microbiology, ancillary and laboratory) are listed below for reference.  ? ?Imaging Studies: ?CT ANGIO HEAD NECK W WO CM ? ?Result Date: 07/23/2021 ?CLINICAL DATA:  Left facial paresthesia EXAM: CT ANGIOGRAPHY HEAD AND NECK TECHNIQUE: Multidetector CT imaging of  the head and neck was performed using the standard protocol during bolus administration of intravenous contrast. Multiplanar CT image reconstructions and MIPs were obtained to evaluate the vascular anatomy. Carotid stenosis measurements (when applicable) are obtained utilizing NASCET criteria, using the distal internal carotid diameter as the denominator. RADIATION DOSE REDUCTION: This exam was performed according to the departmental dose-optimization program which includes automated exposure control, adjustment of the mA and/or kV according to patient size and/or use of iterative  reconstruction technique. CONTRAST:  41mL OMNIPAQUE IOHEXOL 350 MG/ML SOLN COMPARISON:  CT head and brain MRI dated 1 day prior common carotid Doppler 10/06/2011 FINDINGS: CT HEAD FINDINGS Brain: Small acute right thalamic capsular inf

## 2021-07-23 NOTE — Discharge Instructions (Signed)
Fue un Building control surveyor de ti. ?Parece que no est? tomando sus medicamentos para la presi?n arterial, lo que aumenta su riesgo de sufrir este tipo de peque?os accidentes cerebrovasculares. Por favor, empiece a tomar su lisinopril a partir de [place]. ?Su neur?logo le aconsej? tomar aspirina y Plavix juntos durante 21 d?as y luego dejar de tomar Plavix y Educational psychologist con la aspirina. ?Tambi?n est? comenzando con un medicamento para el colesterol, t?melo seg?n las indicaciones. ?Su A1c es 6.2, lo que lo convierte en prediab?tico. Hable con su proveedor de atenci?n primaria y comience a tomar medicamentos si es necesario. En este momento, siga una dieta modificada en carbohidratos. ?Mant?ngase bien hidratado y haga un seguimiento con su m?dico de cabecera y neur?logo. ?

## 2021-07-23 NOTE — Evaluation (Signed)
Occupational Therapy Evaluation ?Patient Details ?Name: Isaiah Rogers ?MRN: [id number] ?DOB: Aug 23, 1945 ?Today's Date: 07/23/2021 ? ? ?History of Present Illness Pt is a 76 yo male that presented to the ED for several days of L sided numbness of his head and face. MRI of the brain which showed remote lacunar infarcts and chronic microvascular ischemic disease. Small acute right thalamocapsular infarct. PMH of stroke, arthritis, DM.  ? ?Clinical Impression ?  ?Isaiah Rogers was seen for OT evaluation this date. Prior to hospital admission, pt was Independent for mobility and ADLs including working at [employer]. Interpreter services utilized throughout, pt inconsistent with report on LUE weakness/numbness. Pt states is at baseline, L shoulder flexion AROM ~100*, strength 4+/5 grossly. Pt demonstrates near baseline independence to perform ADL and mobility tasks. No skilled OT needs identified. Will sign off. Please re-consult if additional OT needs arise.  ? ?Recommendations for follow up therapy are one component of a multi-disciplinary discharge planning process, led by the attending physician.  Recommendations may be updated based on patient status, additional functional criteria and insurance authorization.  ? ?Follow Up Recommendations ? No OT follow up  ?  ?Assistance Recommended at Discharge Set up Supervision/Assistance  ?Patient can return home with the following Help with stairs or ramp for entrance ? ?  ?Functional Status Assessment ? Patient has not had a recent decline in their functional status  ?Equipment Recommendations ? None recommended by OT  ?  ?Recommendations for Other Services   ? ? ?  ?Precautions / Restrictions Precautions ?Precautions: Fall ?Restrictions ?Weight Bearing Restrictions: No  ? ?  ? ?Mobility Bed Mobility ?Overal bed mobility: Modified Independent ?  ?  ?  ?  ?  ?  ?  ?  ? ?Transfers ?Overall transfer level: Independent ?  ?  ?  ?  ?  ?  ?  ?  ?  ?  ? ?  ?Balance Overall balance  assessment: Needs assistance ?Sitting-balance support: Feet supported ?Sitting balance-Isaiah Rogers Scale: Good ?  ?  ?Standing balance support: No upper extremity supported ?Standing balance-Isaiah Rogers Scale: Good ?  ?  ?  ?  ?  ?  ?  ?  ?  ?  ?  ?  ?   ? ?ADL either performed or assessed with clinical judgement  ? ?ADL Overall ADL's : Needs assistance/impaired ?  ?  ?  ?  ?  ?  ?  ?  ?  ?  ?  ?  ?  ?  ?  ?  ?  ?  ?  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 MOD I don/doff B socks in sitting  ? ? ? ? ?Pertinent Vitals/Pain Pain Assessment ?Pain Assessment: No/denies pain  ? ? ? ?Hand Dominance Right ?  ?Extremity/Trunk Assessment Upper Extremity Assessment ?Upper Extremity Assessment: LUE deficits/detail ?LUE Deficits / Details: shoulder flexion AROM ~100*. Strength 4+/5 ?  ?Lower Extremity Assessment ?Lower Extremity Assessment: Generalized weakness ?  ?  ?  ?Communication Communication ?Communication: Interpreter utilized ?  ?Cognition Arousal/Alertness: Awake/alert ?Behavior During Therapy: [facility] for tasks assessed/performed ?Overall Cognitive Status: Within Functional Limits for tasks assessed ?  ?  ?  ?  ?  ?  ?  ?  ?  ?  ?  ?  ?  ?  ?  ?  ?  ?  ?  ?   ?   ?   ? ? ?Home Living Family/patient expects to be discharged to:: Private residence ?Living Arrangements: Non-relatives/Friends (  lives with roommates, in bosses home, who lives there but separately) ?Available Help at Discharge: Friend(s) ?Type of Home: House ?Home Access: Stairs to enter;Level entry ?  ?  ?Home Layout: Two level ?Alternate Level Stairs-Number of Steps: 5-6, doesn't specify which side is the rail on ?  ?  ?  ?  ?  ?  ?Home Equipment: None ?  ?  ?  ? ?  ?Prior Functioning/Environment Prior Level of Function : Independent/Modified Independent ?  ?  ?  ?  ?  ?  ?Mobility Comments: works in a [employer] ?  ?  ? ?  ?  ?OT Problem List: Decreased strength ?  ?   ?   ?OT Goals(Current goals can be found in the care plan section) Acute Rehab  OT Goals ?Patient Stated Goal: to go home ?OT Goal Formulation: With patient ?Time For Goal Achievement: 08/06/21 ?Potential to Achieve Goals: Good  ? ?AM-PAC OT "6 Clicks" Daily Activity     ?Outcome Measure Help from another person eating meals?: None ?Help from another person taking care of personal grooming?: None ?Help from another person toileting, which includes using toliet, bedpan, or urinal?: A Little ?Help from another person bathing (including washing, rinsing, drying)?: A Little ?Help from another person to put on and taking off regular upper body clothing?: None ?Help from another person to put on and taking off regular lower body clothing?: None ?6 Click Score: 22 ?  ?End of Session   ? ?Activity Tolerance: Patient tolerated treatment well ?Patient left: in bed;with call bell/phone within reach ? ?OT Visit Diagnosis: Unsteadiness on feet (R26.81)  ?              ?Time: [id number] ?OT Time Calculation (min): 21 min ?Charges:  OT General Charges ?$OT Visit: 1 Visit ?OT Evaluation ?$OT Eval Moderate Complexity: 1 Mod ? ?Dessie Coma, M.S. OTR/L  ?07/23/21, 1:38 PM  ?ascom [phone] ? ?

## 2021-07-23 NOTE — Evaluation (Addendum)
Physical Therapy Evaluation ?Patient Details ?Name: Isaiah Rogers ?MRN: [id number] ?DOB: 1946/04/02 ?Today's Date: 07/23/2021 ? ?History of Present Illness ? Pt is a 76 yo male that presented to the ED for several days of L sided numbness of his head and face. MRI of the brain which showed remote lacunar infarcts and chronic microvascular ischemic disease. Small acute right thalamocapsular infarct. PMH of stroke, arthritis, DM. ?  ?Clinical Impression ? Patient was alert, sitting EOB with RN at bedside. Interpretor via Lawyer used throughout. He reported at baseline he lives with a house mate on his managers property, works, is independent. ? ?The patient demonstrated decreased LLE strength and coordination, but endorsed that this is his baseline. He was able to perform bed mobility, transfers with modI, and ambulated ~68ft with no AD, supervision. Stair navigation also performed, pt with varying techniques and 1 LOB, but able to self correct.  Overall the patient demonstrated deficits (see "PT Problem List") that impede the patient's functional abilities, safety, and mobility and would benefit from skilled PT intervention. Recommendation is HHPT with intermittent supervision/assistance.    ?   ? ?Recommendations for follow up therapy are one component of a multi-disciplinary discharge planning process, led by the attending physician.  Recommendations may be updated based on patient status, additional functional criteria and insurance authorization. ? ?Follow Up Recommendations Home health PT ? ?  ?Assistance Recommended at Discharge Intermittent Supervision/Assistance  ?Patient can return home with the following ? Assist for transportation ? ?  ?Equipment Recommendations None recommended by PT  ?Recommendations for Other Services ?    ?  ?Functional Status Assessment Patient has had a recent decline in their functional status and demonstrates the ability to make significant improvements in function in a  reasonable and predictable amount of time.  ? ?  ?Precautions / Restrictions Precautions ?Precautions: Fall ?Restrictions ?Weight Bearing Restrictions: No  ? ?  ? ?Mobility ? Bed Mobility ?Overal bed mobility: Modified Independent ?  ?  ?  ?  ?  ?  ?  ?  ? ?Transfers ?Overall transfer level: Modified independent ?Equipment used: None ?  ?  ?  ?  ?  ?  ?  ?Isaiah transfer comment: use of bed rail ?  ? ?Ambulation/Gait ?Ambulation/Gait assistance: Supervision ?Gait Distance (Feet): 60 Feet ?Assistive device: None ?  ?  ?  ?  ?Isaiah Gait Details: walks with flexed gait, decreased stance time on LLE, minimal to no TKE noted ? ?Stairs ?Stairs: Yes ?Stairs assistance: Min guard ?  ?  ?Isaiah stair comments: alternated between step to, alternating, use of rails, and no rails. one small LOB, pt corrrected with rail ? ?Wheelchair Mobility ?  ? ?Modified Rankin (Stroke Patients Only) ?  ? ?  ? ?Balance Overall balance assessment: Needs assistance ?Sitting-balance support: Feet supported ?Sitting balance-Leahy Scale: Good ?  ?  ?  ?Standing balance-Leahy Scale: Good ?  ?  ?  ?  ?  ?  ?  ?  ?  ?  ?  ?  ?   ? ? ? ?Pertinent Vitals/Pain Pain Assessment ?Pain Assessment: No/denies pain  ? ? ?Home Living Family/patient expects to be discharged to:: Private residence ?Living Arrangements: Non-relatives/Friends (lives with roommates, in bosses home, who lives there but separately) ?Available Help at Discharge: Friend(s) ?Type of Home: House ?Home Access: Stairs to enter;Level entry ?  ?  ?Alternate Level Stairs-Number of Steps: 5-6, doesn't specify which side is the rail on ?Home Layout: Two level ?Home  Equipment: None ?   ?  ?Prior Function Prior Level of Function : Independent/Modified Independent ?  ?  ?  ?  ?  ?  ?Mobility Comments: works in a resturant ?  ?  ? ? ?Hand Dominance  ?   ? ?  ?Extremity/Trunk Assessment  ? Upper Extremity Assessment ?Upper Extremity Assessment: Defer to OT evaluation ?  ? ?Lower Extremity  Assessment ?Lower Extremity Assessment: Generalized weakness (overall impaired gross motor of LLE. pt endorsed this is his basline.) ?  ? ?   ?Communication  ? Communication: Interpreter utilized  ?Cognition Arousal/Alertness: Awake/alert ?Behavior During Therapy: [facility] for tasks assessed/performed ?Overall Cognitive Status: Within Functional Limits for tasks assessed ?  ?  ?  ?  ?  ?  ?  ?  ?  ?  ?  ?  ?  ?  ?  ?  ?  ?  ?  ? ?  ?Isaiah Comments   ? ?  ?Exercises    ? ?Assessment/Plan  ?  ?PT Assessment Patient needs continued PT services  ?PT Problem List Decreased mobility;Decreased activity tolerance ? ?   ?  ?PT Treatment Interventions Gait training;Balance training;DME instruction;Therapeutic exercise;Stair training;Neuromuscular re-education;Therapeutic activities;Patient/family education   ? ?PT Goals (Current goals can be found in the Care Plan section)  ?Acute Rehab PT Goals ?Patient Stated Goal: to go home ?PT Goal Formulation: With patient ?Time For Goal Achievement: 08/06/21 ?Potential to Achieve Goals: Good ? ?  ?Frequency 7X/week ?  ? ? ?Co-evaluation   ?  ?  ?  ?  ? ? ?  ?AM-PAC PT "6 Clicks" Mobility  ?Outcome Measure Help needed turning from your back to your side while in a flat bed without using bedrails?: None ?Help needed moving from lying on your back to sitting on the side of a flat bed without using bedrails?: None ?Help needed moving to and from a bed to a chair (including a wheelchair)?: None ?Help needed standing up from a chair using your arms (e.g., wheelchair or bedside chair)?: None ?Help needed to walk in hospital room?: None ?Help needed climbing 3-5 steps with a railing? : None ?6 Click Score: 24 ? ?  ?End of Session Equipment Utilized During Treatment: Gait belt ?Activity Tolerance: Patient tolerated treatment well ?Patient left: in bed;with call bell/phone within reach;with bed alarm set ?Nurse Communication: Mobility status ?PT Visit Diagnosis: Other abnormalities of gait and  mobility (R26.89);Difficulty in walking, not elsewhere classified (R26.2);Muscle weakness (generalized) (M62.81) ?  ? ?Time: 7622-6333 ?PT Time Calculation (min) (ACUTE ONLY): 17 min ? ? ?Charges:   PT Evaluation ?$PT Eval Low Complexity: 1 Low ?PT Treatments ?$Therapeutic Activity: 8-22 mins ?  ?   ? ?Olga Coaster PT, DPT ?11:55 AM,07/23/21 ? ? ?

## 2021-07-23 NOTE — Hospital Course (Addendum)
Taken from H&P. ? ?Isaiah Rogers is a 76 y.o. male with medical history significant for diabetes mellitus, hypertension, history of CVA with no residual deficits who presents to the ER from [facility] where he went for evaluation of left sided numbness involving the left side of his head and face.  He has had symptoms for about 4 to 5 days. ?He decided to follow-up with his primary care provider due to persistence of his symptoms. ?He denies having any focal deficits, no fall, no double vision, no difficulty swallowing, no headache, no blurred vision. ?He has had no chest pain, no shortness of breath, no nausea, no vomiting, no abdominal pain, no urinary symptoms or any changes in his bowel habits. ? ?On arrival to ED he was hemodynamically stable.  Pretty much unremarkable labs with A1c of 6.2, LDL of 100. ? ?Initial CT head without contrast with no evidence of acute intracranial abnormality. ? ?MRI of the brain with a small acute right thalamic capsular infarct, slight edema without mass effect ?Moderate chronic microvascular ischemic disease. 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 ? ?CTA head and neck with 30% stenosis of right proximal ICA, and no other significant large vessel occlusion.  Please see the report as follows ?IMPRESSION: ?1. The known small acute right thalamic capsular infarct is better ?seen on the MRI dated 1 day prior. No evidence of new acute ?intracranial pathology. ?2. Mild atherosclerotic irregularity of the bilateral PCAs left M1 ?segment without proximal high-grade stenosis or occlusion. ?Otherwise, patent intracranial vasculature. ?3. 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 Otherwise, patent vasculature of the neck. ?4. 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 Correlate ?with history and consider nonemergent ENT referral as indicatedd. ? ?Echocardiogram was normal. ? ?PT OT recommended home health  which were ordered. ? ?Apparently patient was not taking his home medications which include MD hypertensives.  Blood pressure elevated.  He was advised to restart his home lisinopril from tomorrow as we are allowing for permissive hypertension for 48 hours. ? ?Neurology also recommended DAPT with aspirin and Plavix for 21 days, followed by aspirin only. ?He was started on high intensity statin. ?A1c of 6.2 and he was not on any antidiabetics at home.  That makes him prediabetic.  He needs to follow a carb modified diet and discuss with primary care provider to start any medications if needed. ? ?Patient will continue with current management and will follow-up with his providers. ? ? ?

## 2021-07-23 NOTE — Progress Notes (Signed)
PT Cancellation Note ? ?Patient Details ?Name: Isaiah Rogers ?MRN: [id number] ?DOB: [birth year] ? ? ?Cancelled Treatment:    Reason Eval/Treat Not Completed: Other (comment). PT session started, but interrupted by transport services. PT to re-attempt as able.  ? ?Olga Coaster PT, DPT ?9:33 AM,07/23/21 ? ?

## 2021-07-23 NOTE — Progress Notes (Signed)
*  PRELIMINARY RESULTS* ?Echocardiogram ?2D Echocardiogram has been performed. ? ?Isaiah Rogers, Dorene Sorrow ?07/23/2021, 9:49 AM ?

## 2021-07-23 NOTE — Progress Notes (Signed)
Neurology Brief Note ? ?Esaias Kromer is a 76 y.o. male with medical history significant for diabetes mellitus, hypertension, history of CVA with no residual deficits who presents to the ER from [facility] where he went for evaluation of left sided numbness involving the left side of his head and face.  He has had symptoms for about 4 to 5 days. On arrival to ED he was noted to have NIHSS = 1 for sensory deficit and no motor weakness. MRI brain showed a small acute ischemic R thalamocapsular infarct. ? ?Stroke workup has already been completed.  ? ?CTA H&N ? ?1. The known small acute right thalamic capsular infarct is better ?seen on the MRI dated 1 day prior. No evidence of new acute ?intracranial pathology. ?2. Mild atherosclerotic irregularity of the bilateral PCAs left M1 ?segment without proximal high-grade stenosis or occlusion. ?Otherwise, patent intracranial vasculature. ?3. 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 Otherwise, patent vasculature of the neck. ?4. 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 Correlate ?with history and consider nonemergent ENT referral as indicated. ? ?TTE - no intracardiac clot or other sig abnl ? ?   ?Component Value Date/Time  ? CHOL 163 07/23/2021 0358  ? CHOL 150 10/06/2011 0215  ? TRIG 118 07/23/2021 0358  ? TRIG 159 10/06/2011 0215  ? HDL 39 (L) 07/23/2021 0358  ? HDL 40 10/06/2011 0215  ? CHOLHDL 4.2 07/23/2021 0358  ? VLDL 24 07/23/2021 0358  ? VLDL 32 10/06/2011 0215  ? [place] 100 (H) 07/23/2021 0358  ? [place] 78 10/06/2011 0215  ? ? ?Lab Results  ?Component Value Date/Time  ? HGBA1C 6.2 (H) 07/22/2021 12:13 PM  ? HGBA1C 6.9 (H) 10/06/2011 02:15 AM  ? ?A/P: Enrike Politis is a 76 y.o. male with medical history significant for diabetes mellitus, hypertension, history of CVA with no residual deficits who presents to the ER from [facility] where he went for evaluation of left sided numbness involving the left  side of his head and face 2/2 a small acute ischemic R thalamocapsular infarct. ? ?- stroke workup is completed ?- goal normotension, avoid hypotension ?- ASA 81mg  daily + plavix 75mg  daily x21 days f/b ASA 81mg  daily monotherapy after that ?- Atorvastatin 80mg  daily ?- I will arrange for outpatient neuro f/u ? ?OK to d/c from neuro standpoint. ? ?Su Monks, MD ?[facility] ?[phone] ? ?If 7pm- 7am, please page neurology on call as listed in [place]. ? ?

## 2022-02-22 ENCOUNTER — Other Ambulatory Visit: Payer: Self-pay

## 2023-10-08 IMAGING — MR MR HEAD W/O CM
12 series · 48 of 48 positions shown · non-contrast
Comparison: Same day CT head.

CLINICAL DATA: hx cva. new left facial numbness, dizziness. eval
cva

EXAM:
MRI HEAD WITHOUT CONTRAST
TECHNIQUE: Multiplanar, multiecho pulse sequences of the brain and surrounding
structures were obtained without intravenous contrast.

[Series 5: ax dwi_tracew · axial · 3.0mm · 0.65mm/px · z∈[-135,+16]mm · 2 of 48 slices shown]
[im 1/48]
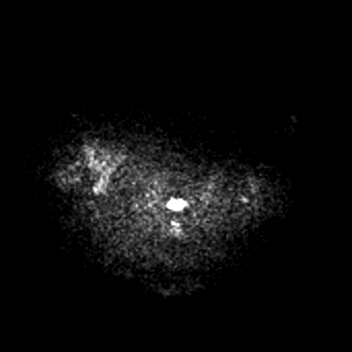
[im 48/48]
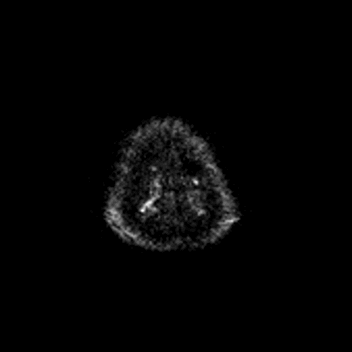

[Series 6: ax dwi_adc · axial · 3.0mm · 0.65mm/px · z∈[-135,+16]mm · 3 of 48 slices shown]
[im 1/48]
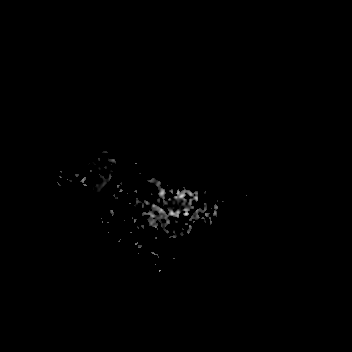
[im 24/48]
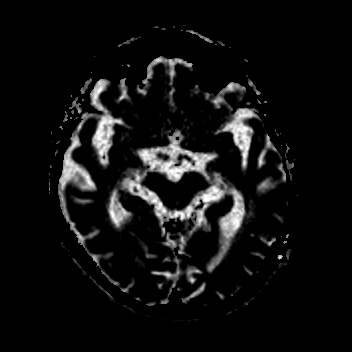
[im 48/48]
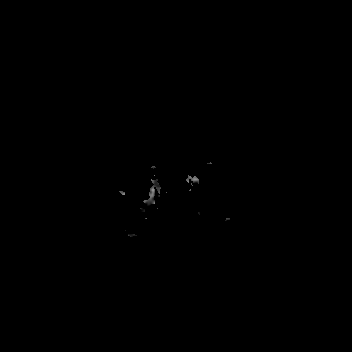

[Series 7: cor dwi_tracew · coronal · 5.0mm · 0.68mm/px · 3 of 40 slices shown]
[im 1/40]
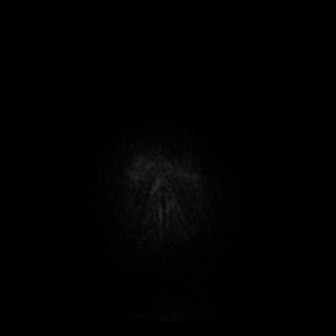
[im 20/40]
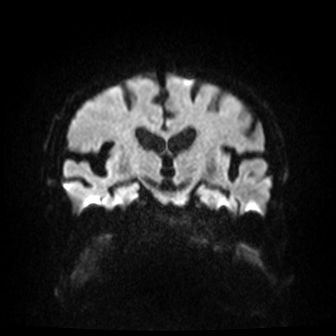
[im 40/40]
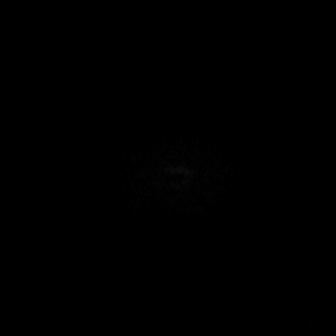

[Series 8: cor dwi_adc · coronal · 5.0mm · 0.68mm/px · 3 of 39 slices shown]
[im 1/39]
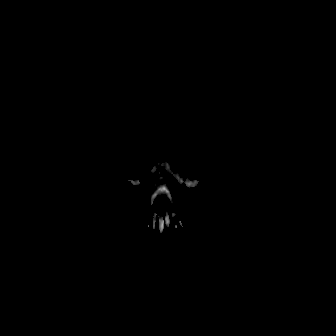
[im 20/39]
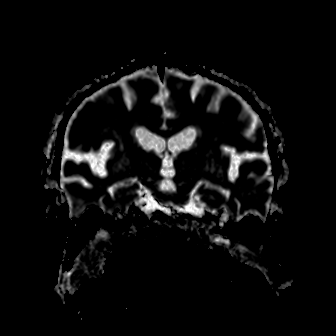
[im 39/39]
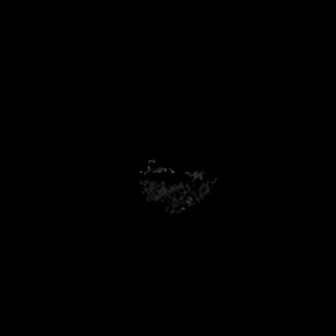

[Series 9: T1 · sagittal · 5.0mm · 0.62mm/px · 2 of 25 slices shown (1 of 2)]
[im 1/25]
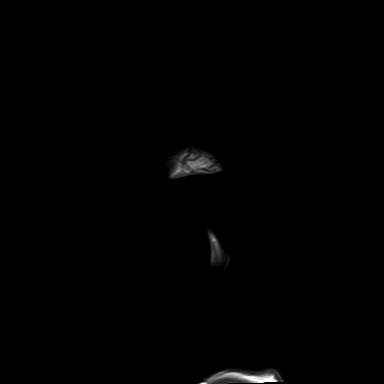
[im 25/25]
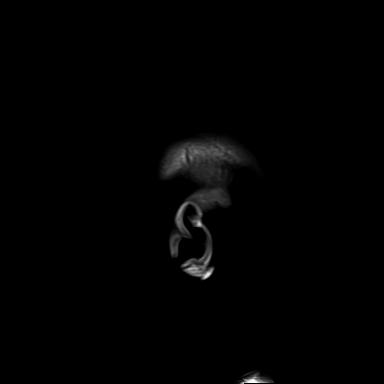

[Series 10: T2 · axial · 5.0mm · 0.53mm/px · z∈[-124,+16]mm · 2 of 25 slices shown (1 of 2)]
[im 1/25]
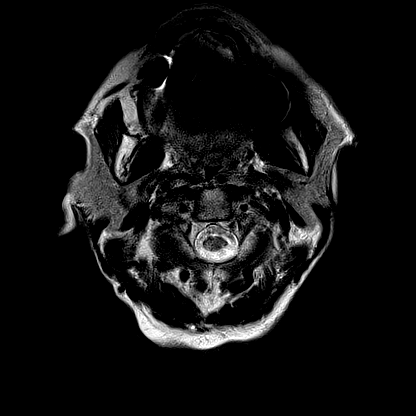
[im 25/25]
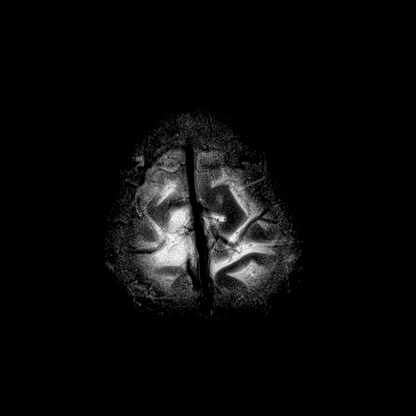

[Series 11: ax swi_mag · axial · 2.0mm · 0.90mm/px · z∈[-132,+22]mm · 5 of 80 slices shown]
[im 1/80]
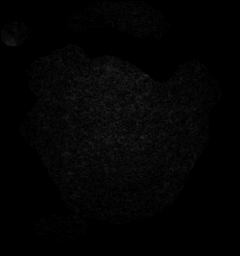
[im 20/80]
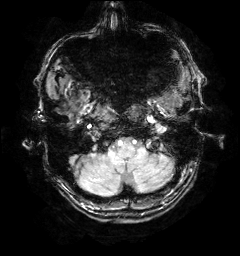
[im 40/80]
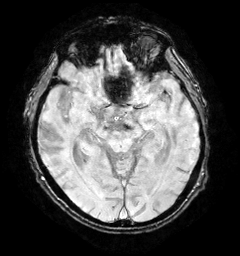
[im 60/80]
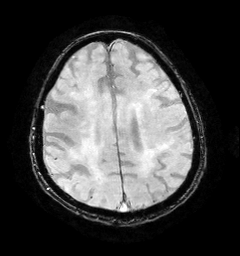
[im 80/80]
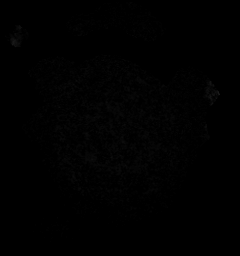

[Series 12: ax swi_pha · axial · 2.0mm · 0.90mm/px · z∈[-132,+22]mm · 5 of 80 slices shown]
[im 1/80]
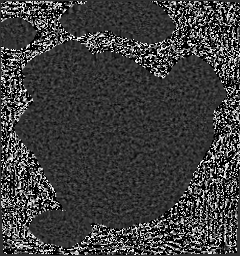
[im 20/80]
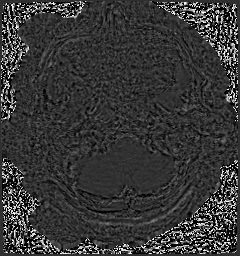
[im 40/80]
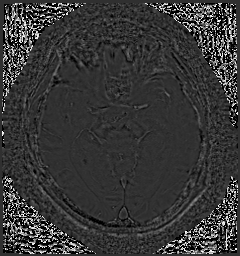
[im 60/80]
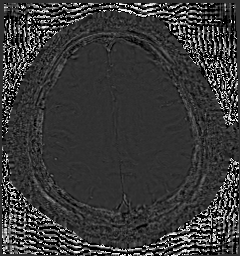
[im 80/80]
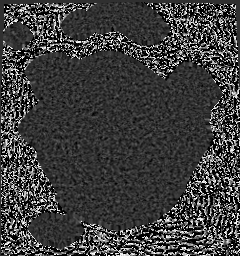

[Series 13: ax swi_swi · axial · 2.0mm · 0.90mm/px · z∈[-132,+22]mm · 5 of 80 slices shown]
[im 1/80]
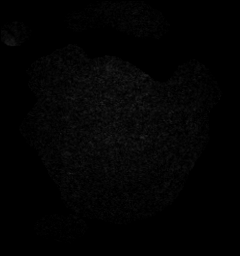
[im 20/80]
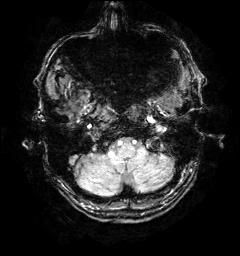
[im 40/80]
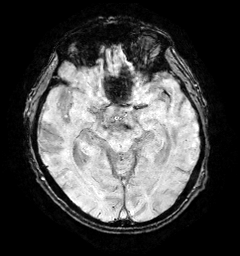
[im 60/80]
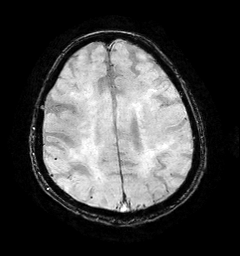
[im 80/80]
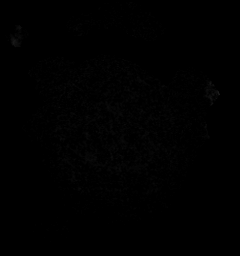

[Series 15: FLAIR · axial · 3.0mm · 0.53mm/px · z∈[-128,+30]mm · 4 of 55 slices shown]
[im 1/55]
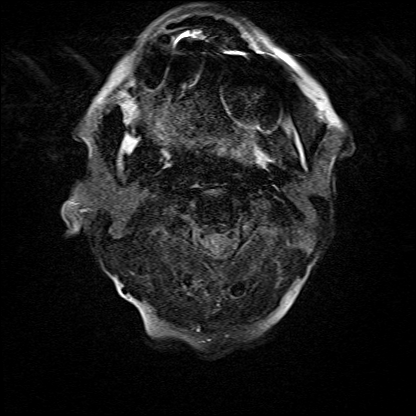
[im 19/55]
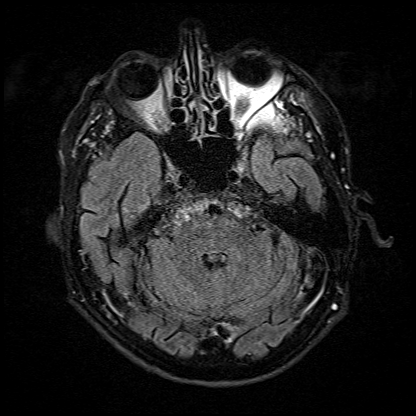
[im 37/55]
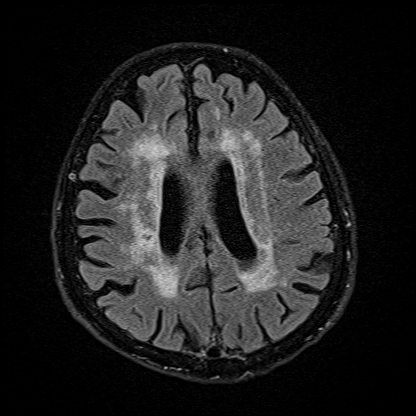
[im 55/55]
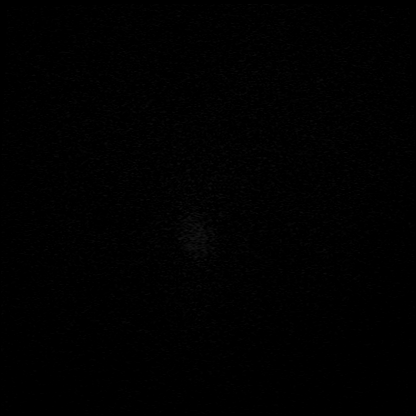

[Series 16: T1 · axial · 1.0mm · 0.98mm/px · z∈[-143,+27]mm · 12 of 176 slices shown (2 of 2)]
[im 1/176]
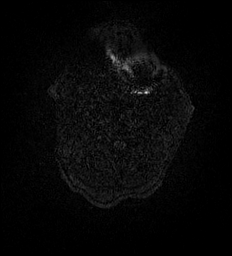
[im 16/176]
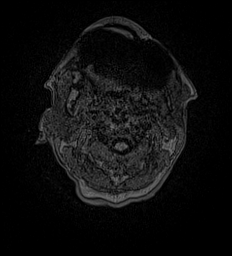
[im 32/176]
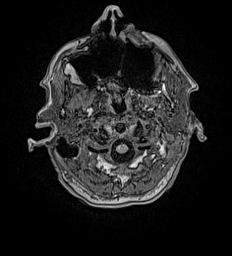
[im 48/176]
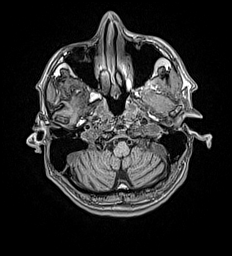
[im 64/176]
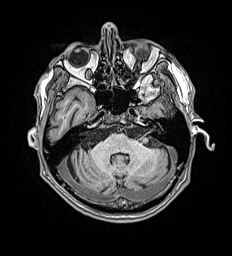
[im 80/176]
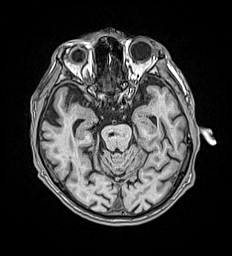
[im 96/176]
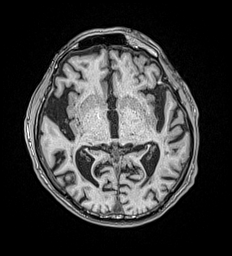
[im 112/176]
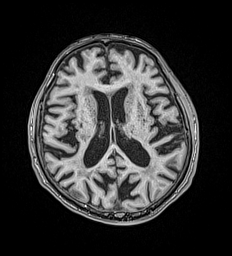
[im 128/176]
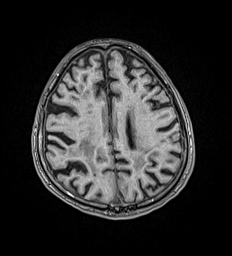
[im 144/176]
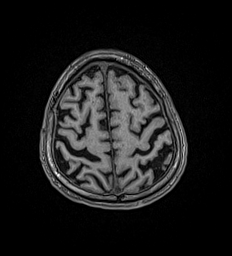
[im 160/176]
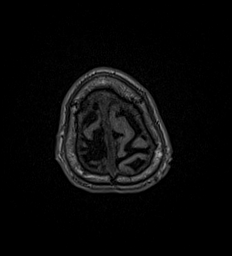
[im 176/176]
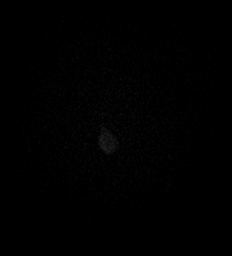

[Series 17: T2 · coronal · 5.0mm · 0.45mm/px · 2 of 31 slices shown (2 of 2)]
[im 1/31]
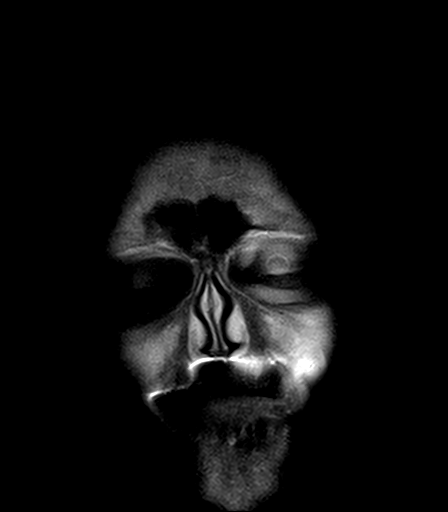
[im 31/31]
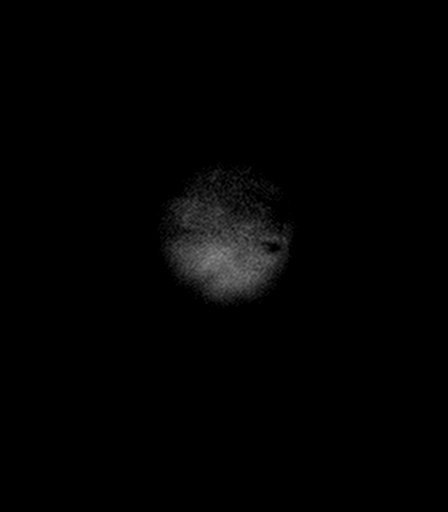

[48 of 48 positions shown; findings below may reference images not displayed]

FINDINGS: Brain: Small acute right thalamocapsular infarct. Slight edema
without mass effect. Additional moderate patchy T2/FLAIR
hyperintensities within the white matter, nonspecific but compatible
with chronic microvascular ischemic disease. No evidence of acute
hemorrhage, mass lesion, midline shift, hydrocephalus. Cerebral
atrophy with ex vacuo ventricular dilation. Multiple foci of
susceptibility artifact within bilateral thalami, basal ganglia, and
pons, probably related to chronic hypertensive microhemorrhages. A
few additional small foci of chronic microhemorrhage in the right
parietal lobe. Remote infarcts in the corona radiata.

Vascular: Major arterial flow voids are maintained at the skull
base.

Skull and upper cervical spine: Normal marrow signal.

Sinuses/Orbits: Clear sinuses.  No acute orbital findings.

Other: No mastoid effusions.
IMPRESSION: 1. Small acute right thalamocapsular infarct. Slight edema without
mass effect.
2. Moderate chronic microvascular ischemic disease.
3. Multiple chronic microhemorrhages, detailed above and probably
hypertensive in etiology.

## 2023-10-09 IMAGING — CT CT ANGIO HEAD-NECK (W OR W/O PERF)
2 of 11 series · 7 of 33 positions shown · IV contrast (APPLIED)
Comparison: CT head and brain MRI dated 1 day prior common carotid
Doppler 10/06/2011

CLINICAL DATA: Left facial paresthesia

EXAM:
CT ANGIOGRAPHY HEAD AND NECK
TECHNIQUE: Multidetector CT imaging of the head and neck was performed using
the standard protocol during bolus administration of intravenous
contrast. Multiplanar CT image reconstructions and MIPs were
obtained to evaluate the vascular anatomy. Carotid stenosis
measurements (when applicable) are obtained utilizing NASCET
criteria, using the distal internal carotid diameter as the
denominator.

[Series 8: cta neck/head · axial · 0.56mm/px · z∈[-166,-52]mm · 2 of 171 slices shown]
[im 57/171  soft-tissue]
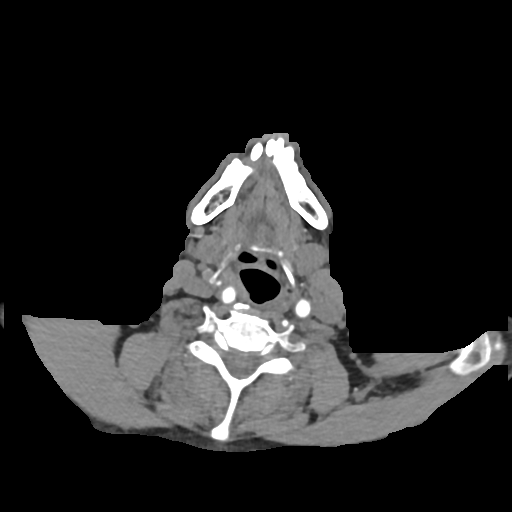
[im 114/171  soft-tissue]
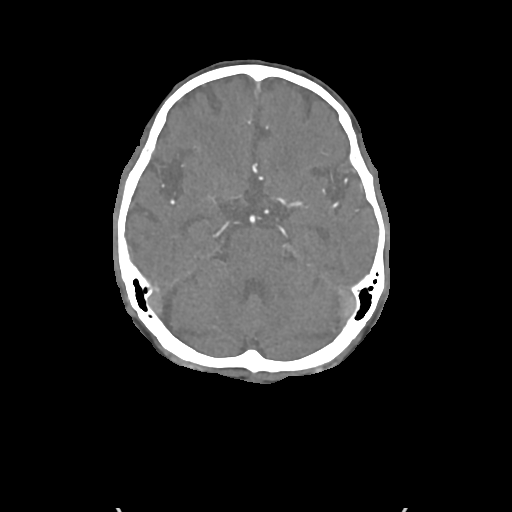

[Series 10: ax thins · axial · 0.39mm/px · z∈[-222,+4]mm · 5 of 340 slices shown]
[im 57/340  soft-tissue]
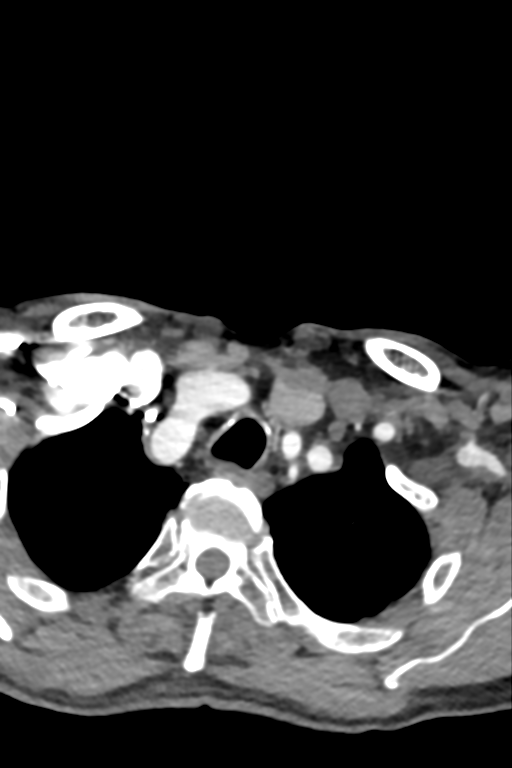
[im 114/340  bone]
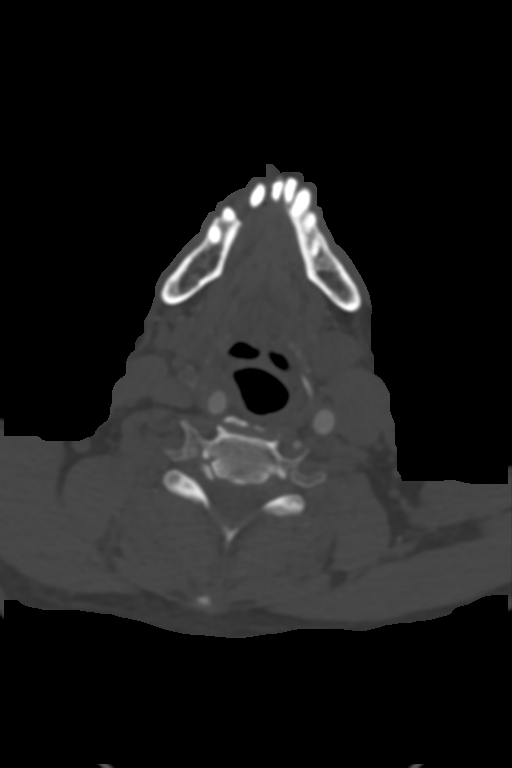
[im 170/340  soft-tissue]
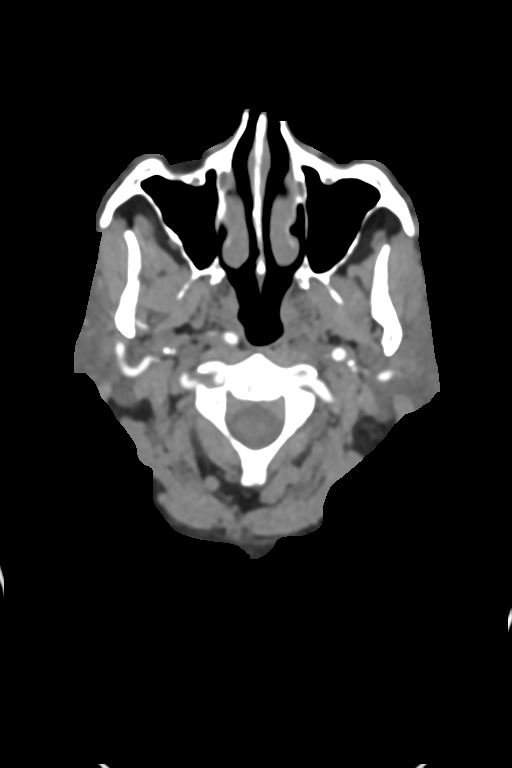
[im 227/340  bone]
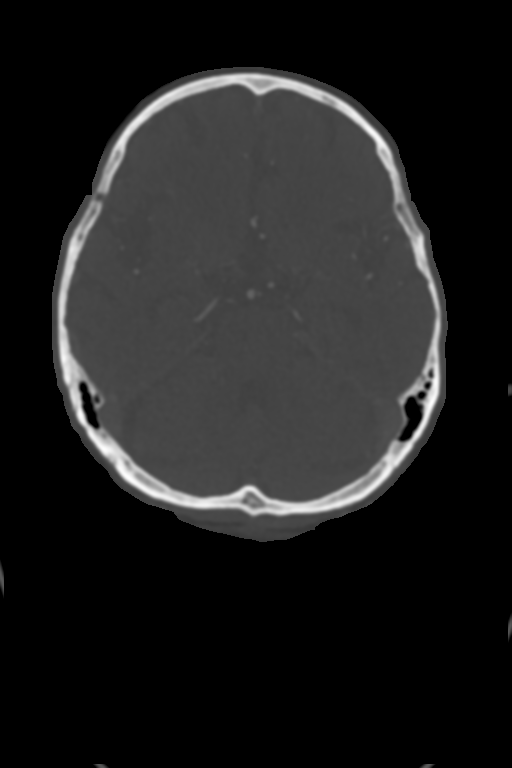
[im 283/340  soft-tissue]
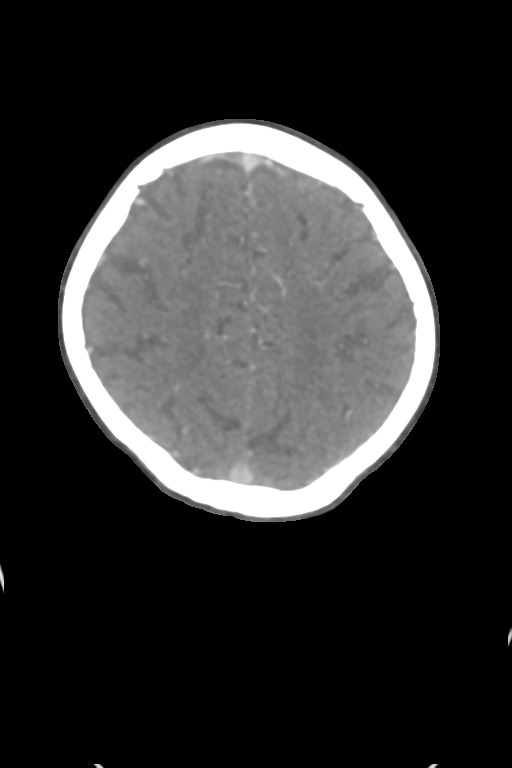

[7 of 33 positions shown; findings below may reference images not displayed]

RADIATION DOSE REDUCTION: This exam was performed according to the
departmental dose-optimization program which includes automated
exposure control, adjustment of the mA and/or kV according to
patient size and/or use of iterative reconstruction technique.

CONTRAST:  75mL OMNIPAQUE IOHEXOL 350 MG/ML SOLN
FINDINGS: CT HEAD FINDINGS

Brain: Small acute right thalamic capsular infarct is better seen on
the MRI from 1 day prior. There is no evidence of new evolved
territorial infarct. There is no acute intracranial hemorrhage or
extra-axial fluid collection.

The ventricles are stable in size. Multiple remote lacunar infarcts
on a background of chronic white matter microangiopathy are
unchanged.

There is no mass lesion.  There is no mass effect or midline shift.

Vascular: See below.

Skull: Normal. Negative for fracture or focal lesion.

Sinuses and orbits: Paranasal sinuses are clear. The globes and
orbits are unremarkable.

Other: None.

Review of the MIP images confirms the above findings

CTA NECK FINDINGS

Aortic arch: The imaged aortic arch is normal. The origins of the
major branch vessels are patent. The subclavian arteries are patent
to the level imaged. There is mild mixed plaque in the proximal left
subclavian artery.

Right carotid system: The right common carotid artery is patent.
There is mixed plaque in the proximal right internal carotid artery
resulting in up to approximately 30% stenosis. The distal right
internal carotid artery is patent. The right external carotid artery
is patent. There is no dissection or aneurysm.

Left carotid system: Left common, internal, and external carotid
arteries are patent, without hemodynamically significant stenosis or
occlusion. There is no significant plaque. There is no dissection or
aneurysm.

Vertebral arteries: The vertebral arteries are patent, without
hemodynamically significant stenosis or occlusion. There is no
dissection or aneurysm.

Skeleton: There is mild multilevel degenerative change of the
cervical spine. There is no acute osseous abnormality or aggressive
osseous lesion. There is no visible canal hematoma.

Other neck: There is asymmetric widening of the right piriform sinus
and laryngeal ventricle with medialization of the right vocal fold.
The soft tissues are otherwise unremarkable.

Upper chest:

Review of the MIP images confirms the above findings

CTA HEAD FINDINGS

Anterior circulation: The intracranial ICAs are patent with minimal
calcified plaque on the left.

The bilateral MCAs are patent with mild irregularity and narrowing
of the left M1 segment. There is no proximal high-grade stenosis or
occlusion.

The bilateral ACAs are patent. The anterior communicating artery is
normal.

There is no aneurysm or AVM.

Posterior circulation: The bilateral V4 segments are patent. There
is mild focal plaque on the left without hemodynamically significant
stenosis. The basilar artery is patent.

There is mild multifocal atherosclerotic irregularity of the
bilateral PCAs without proximal high-grade stenosis or occlusion.
The posterior communicating arteries are not definitely seen.

There is no aneurysm or AVM.

Venous sinuses: As permitted by contrast timing, patent.

Anatomic variants: None.

Review of the MIP images confirms the above findings
IMPRESSION: 1. The known small acute right thalamic capsular infarct is better
seen on the MRI dated 1 day prior. No evidence of new acute
intracranial pathology.
2. Mild atherosclerotic irregularity of the bilateral PCAs left M1
segment without proximal high-grade stenosis or occlusion.
Otherwise, patent intracranial vasculature.
3. Mixed plaque in the proximal right ICA resulting in approximately
30% stenosis. Otherwise, patent vasculature of the neck.
4. Findings suspicious for right vocal cord paralysis. Correlate
with history and consider nonemergent ENT referral as indicatedd.

## 2024-03-29 ENCOUNTER — Other Ambulatory Visit: Payer: Self-pay

## 2024-03-29 ENCOUNTER — Emergency Department

## 2024-03-29 ENCOUNTER — Inpatient Hospital Stay
Admission: EM | Admit: 2024-03-29 | Discharge: 2024-03-31 | DRG: 194 | Disposition: A | Discharge status: Home Health | Attending: Internal Medicine | Admitting: Internal Medicine

## 2024-03-29 DIAGNOSIS — Z87891 Personal history of nicotine dependence: Secondary | ICD-10-CM

## 2024-03-29 DIAGNOSIS — Z8673 Personal history of transient ischemic attack (TIA), and cerebral infarction without residual deficits: Secondary | ICD-10-CM

## 2024-03-29 DIAGNOSIS — E785 Hyperlipidemia, unspecified: Secondary | ICD-10-CM | POA: Diagnosis present

## 2024-03-29 DIAGNOSIS — R262 Difficulty in walking, not elsewhere classified: Secondary | ICD-10-CM

## 2024-03-29 DIAGNOSIS — W1830XA Fall on same level, unspecified, initial encounter: Secondary | ICD-10-CM | POA: Diagnosis present

## 2024-03-29 DIAGNOSIS — J111 Influenza due to unidentified influenza virus with other respiratory manifestations: Secondary | ICD-10-CM | POA: Diagnosis not present

## 2024-03-29 DIAGNOSIS — Z79899 Other long term (current) drug therapy: Secondary | ICD-10-CM

## 2024-03-29 DIAGNOSIS — J101 Influenza due to other identified influenza virus with other respiratory manifestations: Principal | ICD-10-CM | POA: Diagnosis present

## 2024-03-29 DIAGNOSIS — I959 Hypotension, unspecified: Secondary | ICD-10-CM | POA: Diagnosis present

## 2024-03-29 DIAGNOSIS — R54 Age-related physical debility: Secondary | ICD-10-CM | POA: Diagnosis present

## 2024-03-29 DIAGNOSIS — Y92009 Unspecified place in unspecified non-institutional (private) residence as the place of occurrence of the external cause: Secondary | ICD-10-CM

## 2024-03-29 DIAGNOSIS — R7401 Elevation of levels of liver transaminase levels: Secondary | ICD-10-CM | POA: Diagnosis present

## 2024-03-29 DIAGNOSIS — Z7982 Long term (current) use of aspirin: Secondary | ICD-10-CM

## 2024-03-29 DIAGNOSIS — E119 Type 2 diabetes mellitus without complications: Secondary | ICD-10-CM | POA: Diagnosis present

## 2024-03-29 DIAGNOSIS — Y92008 Other place in unspecified non-institutional (private) residence as the place of occurrence of the external cause: Secondary | ICD-10-CM

## 2024-03-29 DIAGNOSIS — W19XXXA Unspecified fall, initial encounter: Secondary | ICD-10-CM

## 2024-03-29 DIAGNOSIS — N179 Acute kidney failure, unspecified: Secondary | ICD-10-CM | POA: Diagnosis present

## 2024-03-29 DIAGNOSIS — I10 Essential (primary) hypertension: Secondary | ICD-10-CM | POA: Diagnosis present

## 2024-03-29 DIAGNOSIS — M6282 Rhabdomyolysis: Secondary | ICD-10-CM | POA: Diagnosis present

## 2024-03-29 DIAGNOSIS — G8929 Other chronic pain: Secondary | ICD-10-CM | POA: Diagnosis present

## 2024-03-29 DIAGNOSIS — Z1152 Encounter for screening for COVID-19: Secondary | ICD-10-CM

## 2024-03-29 DIAGNOSIS — E860 Dehydration: Secondary | ICD-10-CM | POA: Diagnosis present

## 2024-03-29 DIAGNOSIS — R531 Weakness: Secondary | ICD-10-CM

## 2024-03-29 LAB — HEMOGLOBIN A1C
Hgb A1c MFr Bld: 6.3 % — ABNORMAL HIGH (ref 4.8–5.6)
Mean Plasma Glucose: 134.11 mg/dL

## 2024-03-29 LAB — PROCALCITONIN: Procalcitonin: 4.87 ng/mL

## 2024-03-29 LAB — URINALYSIS, W/ REFLEX TO CULTURE (INFECTION SUSPECTED)
Bacteria, UA: NONE SEEN
Bilirubin Urine: NEGATIVE
Glucose, UA: NEGATIVE mg/dL
Ketones, ur: NEGATIVE mg/dL
Leukocytes,Ua: NEGATIVE
Nitrite: NEGATIVE
Protein, ur: 30 mg/dL — AB
Specific Gravity, Urine: 1.02 (ref 1.005–1.030)
Squamous Epithelial / HPF: 0 /HPF (ref 0–5)
pH: 5 (ref 5.0–8.0)

## 2024-03-29 LAB — COMPREHENSIVE METABOLIC PANEL WITH GFR
ALT: 27 U/L (ref 0–44)
AST: 90 U/L — ABNORMAL HIGH (ref 15–41)
Albumin: 3.9 g/dL (ref 3.5–5.0)
Alkaline Phosphatase: 76 U/L (ref 38–126)
Anion gap: 15 (ref 5–15)
BUN: 32 mg/dL — ABNORMAL HIGH (ref 8–23)
CO2: 24 mmol/L (ref 22–32)
Calcium: 8.8 mg/dL — ABNORMAL LOW (ref 8.9–10.3)
Chloride: 99 mmol/L (ref 98–111)
Creatinine, Ser: 1.58 mg/dL — ABNORMAL HIGH (ref 0.61–1.24)
GFR, Estimated: 45 mL/min — ABNORMAL LOW
Glucose, Bld: 137 mg/dL — ABNORMAL HIGH (ref 70–99)
Potassium: 4.1 mmol/L (ref 3.5–5.1)
Sodium: 137 mmol/L (ref 135–145)
Total Bilirubin: 0.8 mg/dL (ref 0.0–1.2)
Total Protein: 6.6 g/dL (ref 6.5–8.1)

## 2024-03-29 LAB — GLUCOSE, CAPILLARY
Glucose-Capillary: 94 mg/dL (ref 70–99)
Glucose-Capillary: 109 mg/dL — ABNORMAL HIGH (ref 70–99)
Glucose-Capillary: 133 mg/dL — ABNORMAL HIGH (ref 70–99)
Glucose-Capillary: 118 mg/dL — ABNORMAL HIGH (ref 70–99)

## 2024-03-29 LAB — CBC
HCT: 33.2 % — ABNORMAL LOW (ref 39.0–52.0)
Hemoglobin: 11 g/dL — ABNORMAL LOW (ref 13.0–17.0)
MCH: 29.5 pg (ref 26.0–34.0)
MCHC: 33.1 g/dL (ref 30.0–36.0)
MCV: 89 fL (ref 80.0–100.0)
Platelets: 225 K/uL (ref 150–400)
RBC: 3.73 MIL/uL — ABNORMAL LOW (ref 4.22–5.81)
RDW: 13.6 % (ref 11.5–15.5)
WBC: 10.2 K/uL (ref 4.0–10.5)
nRBC: 0 % (ref 0.0–0.2)

## 2024-03-29 LAB — CBC WITH DIFFERENTIAL/PLATELET
Abs Immature Granulocytes: 0.07 K/uL (ref 0.00–0.07)
Basophils Absolute: 0 K/uL (ref 0.0–0.1)
Basophils Relative: 0 %
Eosinophils Absolute: 0 K/uL (ref 0.0–0.5)
Eosinophils Relative: 0 %
HCT: 33.7 % — ABNORMAL LOW (ref 39.0–52.0)
Hemoglobin: 11.1 g/dL — ABNORMAL LOW (ref 13.0–17.0)
Immature Granulocytes: 1 %
Lymphocytes Relative: 4 %
Lymphs Abs: 0.6 K/uL — ABNORMAL LOW (ref 0.7–4.0)
MCH: 29.5 pg (ref 26.0–34.0)
MCHC: 32.9 g/dL (ref 30.0–36.0)
MCV: 89.6 fL (ref 80.0–100.0)
Monocytes Absolute: 1 K/uL (ref 0.1–1.0)
Monocytes Relative: 8 %
Neutro Abs: 12.1 K/uL — ABNORMAL HIGH (ref 1.7–7.7)
Neutrophils Relative %: 87 %
Platelets: 254 K/uL (ref 150–400)
RBC: 3.76 MIL/uL — ABNORMAL LOW (ref 4.22–5.81)
RDW: 13.2 % (ref 11.5–15.5)
WBC: 13.8 K/uL — ABNORMAL HIGH (ref 4.0–10.5)
nRBC: 0 % (ref 0.0–0.2)

## 2024-03-29 LAB — LACTIC ACID, PLASMA
Lactic Acid, Venous: 1.4 mmol/L (ref 0.5–1.9)
Lactic Acid, Venous: 1 mmol/L (ref 0.5–1.9)

## 2024-03-29 LAB — CREATININE, SERUM
Creatinine, Ser: 1.55 mg/dL — ABNORMAL HIGH (ref 0.61–1.24)
GFR, Estimated: 46 mL/min — ABNORMAL LOW

## 2024-03-29 LAB — RESP PANEL BY RT-PCR (RSV, FLU A&B, COVID)  RVPGX2
Influenza A by PCR: POSITIVE — AB
Influenza B by PCR: NEGATIVE
Resp Syncytial Virus by PCR: NEGATIVE
SARS Coronavirus 2 by RT PCR: NEGATIVE

## 2024-03-29 LAB — CK: Total CK: 7382 U/L — ABNORMAL HIGH (ref 49–397)

## 2024-03-29 MED ORDER — ACETAMINOPHEN 325 MG PO TABS
650.0000 mg | ORAL_TABLET | Freq: Four times a day (QID) | ORAL | Status: DC | PRN
  Administered 2024-03-30 (×2): 650 mg via ORAL
  Filled 2024-03-29 (×2): qty 2

## 2024-03-29 MED ORDER — ONDANSETRON HCL 4 MG/2ML IJ SOLN: 4.0000 mg | Freq: Four times a day (QID) | INTRAMUSCULAR | Status: DC | PRN

## 2024-03-29 MED ORDER — INSULIN ASPART 100 UNIT/ML IJ SOLN: 0.0000 [IU] | Freq: Three times a day (TID) | INTRAMUSCULAR | Status: DC

## 2024-03-29 MED ORDER — HEPARIN SODIUM (PORCINE) 5000 UNIT/ML IJ SOLN
5000.0000 [IU] | Freq: Three times a day (TID) | INTRAMUSCULAR | Status: DC
  Administered 2024-03-29 – 2024-03-31 (×6): 5000 [IU] via SUBCUTANEOUS
  Filled 2024-03-29 (×6): qty 1

## 2024-03-29 MED ORDER — OSELTAMIVIR PHOSPHATE 75 MG PO CAPS: 75.0000 mg | ORAL_CAPSULE | Freq: Two times a day (BID) | ORAL | Status: DC

## 2024-03-29 MED ORDER — ACETAMINOPHEN 650 MG RE SUPP: 650.0000 mg | Freq: Four times a day (QID) | RECTAL | Status: DC | PRN

## 2024-03-29 MED ORDER — OXYCODONE HCL 5 MG PO TABS: 5.0000 mg | ORAL_TABLET | ORAL | Status: DC | PRN

## 2024-03-29 MED ORDER — SODIUM CHLORIDE 0.9 % IV SOLN
2.0000 g | INTRAVENOUS | Status: DC
  Administered 2024-03-30: 2 g via INTRAVENOUS
  Filled 2024-03-29: qty 20

## 2024-03-29 MED ORDER — SODIUM CHLORIDE 0.9 % IV BOLUS
1000.0000 mL | Freq: Once | INTRAVENOUS | Status: AC
  Administered 2024-03-29: 1000 mL via INTRAVENOUS

## 2024-03-29 MED ORDER — MORPHINE SULFATE (PF) 2 MG/ML IV SOLN: 2.0000 mg | INTRAVENOUS | Status: DC | PRN

## 2024-03-29 MED ORDER — SODIUM CHLORIDE 0.9 % IV SOLN
500.0000 mg | INTRAVENOUS | Status: DC
  Administered 2024-03-30 – 2024-03-31 (×2): 500 mg via INTRAVENOUS
  Filled 2024-03-29 (×2): qty 5

## 2024-03-29 MED ORDER — POLYETHYLENE GLYCOL 3350 17 G PO PACK: 17.0000 g | PACK | Freq: Every day | ORAL | Status: DC | PRN

## 2024-03-29 MED ORDER — ATORVASTATIN CALCIUM 20 MG PO TABS: 40.0000 mg | ORAL_TABLET | Freq: Every day | ORAL | Status: DC

## 2024-03-29 MED ORDER — SODIUM CHLORIDE 0.9 % IV SOLN: INTRAVENOUS | Status: AC

## 2024-03-29 MED ORDER — HYDRALAZINE HCL 20 MG/ML IJ SOLN: 10.0000 mg | Freq: Four times a day (QID) | INTRAMUSCULAR | Status: DC | PRN

## 2024-03-29 MED ORDER — FERROUS SULFATE 325 (65 FE) MG PO TABS
325.0000 mg | ORAL_TABLET | Freq: Every day | ORAL | Status: DC
  Administered 2024-03-30 – 2024-03-31 (×2): 325 mg via ORAL
  Filled 2024-03-29 (×2): qty 1

## 2024-03-29 MED ORDER — ONDANSETRON HCL 4 MG PO TABS: 4.0000 mg | ORAL_TABLET | Freq: Four times a day (QID) | ORAL | Status: DC | PRN

## 2024-03-29 MED ORDER — ASPIRIN 81 MG PO CHEW
81.0000 mg | CHEWABLE_TABLET | Freq: Every day | ORAL | Status: DC
  Administered 2024-03-29 – 2024-03-31 (×3): 81 mg via ORAL
  Filled 2024-03-29 (×3): qty 1

## 2024-03-29 MED ORDER — LISINOPRIL 10 MG PO TABS: 20.0000 mg | ORAL_TABLET | Freq: Every day | ORAL | Status: DC

## 2024-03-29 MED ORDER — OSELTAMIVIR PHOSPHATE 30 MG PO CAPS
30.0000 mg | ORAL_CAPSULE | Freq: Two times a day (BID) | ORAL | Status: DC
  Administered 2024-03-29 – 2024-03-30 (×4): 30 mg via ORAL
  Filled 2024-03-29 (×6): qty 1

## 2024-03-29 NOTE — ED Notes (Signed)
 Reviewed updated patient account of injury with Dr. Cyrena, no new orders received.

## 2024-03-29 NOTE — ED Notes (Signed)
 Review of patient injury. Patient returned home from work at night on 12/18 around 2230. He climbed stairs to enter house and then felt weakness in his legs resulting in him to fall down the stairs. Surgery in left shoulder and right leg that give him baseline deficits of mobility. Unable to give names of surgeries. Able to move all extremities and follow commands. Son last saw patient on 12/17 around noon with no concerns.

## 2024-03-29 NOTE — H&P (Addendum)
 "  History and Physical    Isaiah Rogers FMW:[id number] DOB: 06-23-1945 DOA: 03/29/2024  DOS: the patient was seen and examined on 03/29/2024  PCP: [facility]   Patient coming from: Home  I have personally briefly reviewed patient's old medical records in [facility]  Chief Complaint: Fall at home  HPI: Isaiah Rogers is a pleasant 78 y.o. male with medical history significant for diabetes not on any medications, prior history of stroke, HTN, HLD, history of back surgery who came in for a unwitnessed fall at home yesterday afternoon.  He stated that he came back from work and fell at his house.  He was alone.  He tried to get up from the floor took 1 hour and then went right walk around he was very weak and came to the emergency room for evaluation.  Patient's cousin was here in the emergency room and stated that patient was at her normal state of health until yesterday but he has some chronic weakness on his legs but they are weak at this point.  He has a history of back pain but did not get better with the surgery. Patient denies any fever, cough, shortness of breath only his complaint was generalized weakness and not able to ambulate. He is Spanish-speaking and I interviewed with him with the help of interpreter.  ED Course: Upon arrival to the ED, patient is found to be slightly hypotensive at 97/60, tachycardic around 96, saturating 96% on room air, positive influenza A, WBC 13.8 EKG shows sinus rhythm, x-ray shows no acute pathology.  CT head with no acute intracranial abnormalities, CT C-spine showed no evidence of fracture, CT lumbar spine showed s/p laminectomies with spinal fusion at L5-S1.  Knee x-ray showed no fracture.  Patient was given normal saline 1 L and hospitalist service was consulted for evaluation for admission.  Review of Systems:  ROS  All other systems negative except as noted in the HPI.  Past Medical History:  Diagnosis Date   Arthritis    lumbar  spondylosis   Diabetes mellitus without complication (HCC)    told that he has diabetes but not treated    Hypertension    Stroke [facility])     No past surgical history on file.   reports that he has quit smoking. His smoking use included cigarettes. He quit smokeless tobacco use about 33 years ago. He reports that he does not drink alcohol and does not use drugs.  Allergies[1]  No family history on file.  Prior to Admission medications  Medication Sig Start Date End Date Taking? Authorizing Provider  aspirin  81 MG chewable tablet Chew 1 tablet (81 mg total) by mouth daily. 07/24/21   Amin, Sumayya, MD  atorvastatin  (LIPITOR) 40 MG tablet Take 1 tablet (40 mg total) by mouth daily. 07/24/21   Amin, Sumayya, MD  Cyanocobalamin  (VITAMIN B12 PO) Take 1 tablet by mouth at bedtime.    [provider]  ferrous sulfate  325 (65 FE) MG tablet Take 325 mg by mouth daily with breakfast.    [provider]  gabapentin  (NEURONTIN ) 300 MG capsule Take 300 mg by mouth 3 (three) times daily.  Patient not taking: Reported on 07/23/2021    [provider]  Liniments (SALONPAS ) PADS Apply 1 each topically daily as needed (back pain).    [provider]  lisinopril  (ZESTRIL ) 20 MG tablet Take 1 tablet (20 mg total) by mouth daily. 07/23/21   Caleen Qualia, MD  rOPINIRole  (REQUIP )  0.5 MG tablet Take 0.5 mg by mouth at bedtime. Patient not taking: Reported on 07/23/2021    [provider]    Physical Exam: Vitals:   03/29/24 0730 03/29/24 1100 03/29/24 1130 03/29/24 1200  BP: 106/63 129/71 121/66 123/66  Pulse: 69 77 80 78  Resp:    15  Temp:      TempSrc:      SpO2: 94% 93% 94% 94%  Weight:      Height:        Physical Exam   Constitutional: Alert, awake, calm, comfortable, dry and dehydrated HEENT: Neck supple Respiratory: Clear to auscultation B/L, no wheezing, no rales.  Cardiovascular: Regular rate and rhythm, no murmurs / rubs / gallops. No extremity  edema. 2+ pedal pulses. No carotid bruits.  Abdomen: Soft, no tenderness, Bowel sounds positive.  Musculoskeletal: no clubbing / cyanosis. Good ROM, no contractures. Normal muscle tone.  Skin: no rashes, lesions, ulcers. Neurologic: CN 2-12 grossly intact. Sensation intact, No focal deficit identified Psychiatric: Alert and oriented x 3. Normal mood.    Labs on Admission: I have personally reviewed following labs and imaging studies  CBC: Recent Labs  Lab 03/29/24 0227 03/29/24 1142  WBC 13.8* 10.2  NEUTROABS 12.1*  --   HGB 11.1* 11.0*  HCT 33.7* 33.2*  MCV 89.6 89.0  PLT 254 225   Basic Metabolic Panel: Recent Labs  Lab 03/29/24 0227 03/29/24 1142  NA 137  --   K 4.1  --   CL 99  --   CO2 24  --   GLUCOSE 137*  --   BUN 32*  --   CREATININE 1.58* 1.55*  CALCIUM  8.8*  --    GFR: Estimated Creatinine Clearance: 33.4 mL/min (A) (by C-G formula based on SCr of 1.55 mg/dL (H)). Liver Function Tests: Recent Labs  Lab 03/29/24 0227  AST 90*  ALT 27  ALKPHOS 76  BILITOT 0.8  PROT 6.6  ALBUMIN 3.9   No results for input(s): LIPASE, AMYLASE in the last 168 hours. No results for input(s): AMMONIA in the last 168 hours. Coagulation Profile: No results for input(s): INR, PROTIME in the last 168 hours. Cardiac Enzymes: No results for input(s): CKTOTAL, CKMB, CKMBINDEX, TROPONINI, TROPONINIHS in the last 168 hours. BNP (last 3 results) No results for input(s): BNP in the last 8760 hours. HbA1C: No results for input(s): HGBA1C in the last 72 hours. CBG: No results for input(s): GLUCAP in the last 168 hours. Lipid Profile: No results for input(s): CHOL, HDL, LDLCALC, TRIG, CHOLHDL, LDLDIRECT in the last 72 hours. Thyroid Function Tests: No results for input(s): TSH, T4TOTAL, FREET4, T3FREE, THYROIDAB in the last 72 hours. Anemia Panel: No results for input(s): VITAMINB12, FOLATE, FERRITIN, TIBC, IRON,  RETICCTPCT in the last 72 hours. Urine analysis:    Component Value Date/Time   COLORURINE AMBER (A) 03/29/2024 1100   APPEARANCEUR HAZY (A) 03/29/2024 1100   LABSPEC 1.020 03/29/2024 1100   PHURINE 5.0 03/29/2024 1100   GLUCOSEU NEGATIVE 03/29/2024 1100   HGBUR LARGE (A) 03/29/2024 1100   BILIRUBINUR NEGATIVE 03/29/2024 1100   KETONESUR NEGATIVE 03/29/2024 1100   PROTEINUR 30 (A) 03/29/2024 1100   NITRITE NEGATIVE 03/29/2024 1100   LEUKOCYTESUR NEGATIVE 03/29/2024 1100    Radiological Exams on Admission: I have personally reviewed images DG Knee Complete 4 Views Left Result Date: 03/29/2024 CLINICAL DATA:  Operation to left knee. EXAM: LEFT KNEE - COMPLETE 4+ VIEW COMPARISON:  None Available. FINDINGS: No evidence of fracture, dislocation,  or joint effusion. Mild tricompartmental degenerative changes are noted. There is mild vascular calcification. Soft tissues are otherwise unremarkable. IMPRESSION: Mild tricompartmental degenerative changes. Electronically Signed   By: Suzen Dials M.D.   On: 03/29/2024 10:08   DG Chest Portable 1 View Result Date: 03/29/2024 CLINICAL DATA:  Weakness and leukocytosis. EXAM: PORTABLE CHEST 1 VIEW COMPARISON:  None Available. FINDINGS: The heart size and mediastinal contours are within normal limits. Low lung volumes are noted. No acute infiltrate, pleural effusion or pneumothorax is identified. A 10 mm diameter calcified nodule is seen overlying the left lung base. Multilevel degenerative changes are present throughout the thoracic spine. IMPRESSION: Low lung volumes without acute or active cardiopulmonary disease. Electronically Signed   By: Suzen Dials M.D.   On: 03/29/2024 10:04   CT Lumbar Spine Wo Contrast Result Date: 03/29/2024 EXAM: CT OF THE LUMBAR SPINE WITHOUT CONTRAST 03/29/2024 08:46:59 AM TECHNIQUE: CT of the lumbar spine was performed without the administration of intravenous contrast. Multiplanar reformatted images are  provided for review. Automated exposure control, iterative reconstruction, and/or weight based adjustment of the mA/kV was utilized to reduce the radiation dose to as low as reasonably achievable. COMPARISON: MRI of the lumbar spine dated 09/01/2014. CLINICAL HISTORY: History of foraminal stenosis causing leg weakness, new inability to walk, recent fall. FINDINGS: BONES AND ALIGNMENT: Normal vertebral body heights. No acute fracture or suspicious bone lesion. Persistent grade 2 anterolisthesis at L5-S1. Decompression laminectomies and bilateral posterolateral spinal fusion at L5-S1. DEGENERATIVE CHANGES: At L5-S1, there is complete obliteration of the disc space, which is fused. There is mild-to-moderate central spinal canal stenosis at this level. At L4-L5, there is mild central spinal canal stenosis and mild bilateral lateral recess stenosis. There are moderate facet hypertrophic changes. At L4-L5, there is some moderate amount of streak artifact. SOFT TISSUES: Mild calcification within the abdominal aorta. No acute abnormality. IMPRESSION: 1. Status post decompression laminectomies and bilateral posterolateral spinal fusion at L5-S1 with complete obliteration of the disc space, persistent grade 2 anterolisthesis, and mild-to-moderate central spinal canal stenosis. 2. At L4-5, mild central spinal canal stenosis, mild bilateral lateral recess stenosis, and moderate facet hypertrophic changes. Evaluation is limited by moderate spray artifact. 3. Mild abdominal aortic atherosclerotic calcification. Electronically signed by: Evalene Coho MD 03/29/2024 09:08 AM EST RP Workstation: [id number]   CT Cervical Spine Wo Contrast Result Date: 03/29/2024 EXAM: CT CERVICAL SPINE WITHOUT CONTRAST 03/29/2024 08:46:59 AM TECHNIQUE: CT of the cervical spine was performed without the administration of intravenous contrast. Multiplanar reformatted images are provided for review. Automated exposure control, iterative  reconstruction, and/or weight based adjustment of the mA/kV was utilized to reduce the radiation dose to as low as reasonably achievable. COMPARISON: None available. CLINICAL HISTORY: trauma, difficulty walking FINDINGS: BONES AND ALIGNMENT: There is no evidence of fracture or acute traumatic injury. DEGENERATIVE CHANGES: There is a 3 mm of degenerative anterolisthesis present at C5-C6. There is mild-to-moderate facet arthrosis present bilaterally. SOFT TISSUES: No prevertebral soft tissue swelling. IMPRESSION: 1. No evidence of fracture or acute traumatic injury. 2. 3 mm degenerative anterolisthesis at C5-6 with mild-to-moderate facet arthrosis bilaterally. Electronically signed by: Evalene Coho MD 03/29/2024 08:56 AM EST RP Workstation: [id number]   CT HEAD WO CONTRAST ( ) Result Date: 03/29/2024 EXAM: CT HEAD WITHOUT CONTRAST 03/29/2024 08:46:59 AM TECHNIQUE: CT of the head was performed without the administration of intravenous contrast. Automated exposure control, iterative reconstruction, and/or weight based adjustment of the mA/kV was utilized to reduce the radiation dose to as low as  reasonably achievable. COMPARISON: CT angiogram of the head dated 07/23/2021 and CT of the head dated 07/22/2021. CLINICAL HISTORY: trauma, difficulty walking FINDINGS: BRAIN AND VENTRICLES: No acute hemorrhage. No evidence of acute infarct. Patchy and confluent decreased attenuation throughout bilateral cerebral hemispheres' deep and periventricular white matter, compatible with chronic microvascular ischemic disease. Bilateral basal ganglia chronic lacunar infarctions. Cerebral ventricle sizes concordant with cerebral volume loss. No extra-axial collection. No mass effect or midline shift. Atherosclerotic calcifications within cavernous internal carotid arteries. ORBITS: No acute abnormality. SINUSES: No acute abnormality. SOFT TISSUES AND SKULL: No acute soft tissue abnormality. No skull fracture. IMPRESSION: 1. No  acute intracranial abnormality related to the reported trauma. 2. Chronic microvascular ischemic disease in bilateral cerebral hemispheres' deep and periventricular white matter. 3. Bilateral basal ganglia chronic lacunar infarctions. 4. Cerebral volume loss with concordant ventricular enlargement. 5. Atherosclerotic calcifications within cavernous internal carotid arteries. Electronically signed by: Evalene Coho MD 03/29/2024 08:51 AM EST RP Workstation: [id number]    EKG: My personal interpretation of EKG shows: Normal sinus rhythm    Assessment/Plan Principal Problem:   Flu syndrome Active Problems:   Hypertension   Diabetes mellitus without complication (HCC)   Fall at home, initial encounter   Chronic back pain    Assessment and Plan: 78 year old male with history of diabetes not on any medication, HTN, HLD, history of prior stroke, chronic back pain s/p surgeries came in for a fall at home and positive A.  1.  Cough/shortness of breath/positive flu A - Patient appears severely dehydrated - Has a leukocytosis, low blood pressure and tachycardia. - He is coughing thick yellow sputum. - I suspect that he has a possible bacterial infection likely bronchitis/pneumonia. - Chest x-ray did not show any obvious pneumonia, I am leaning towards acute bronchitis. - He will be started on IV fluid, antibiotics to cover for bronchitis/pneumonia, Tamiflu - Droplet precaution - Continue to monitor oxygen to maintain saturation more than 90%.  2.  Generalized weakness/severe deconditioning - Patient fell at home - Will check CPK to rule out rhabdomyolysis - CT scan of the brain, C-spine, lumbar spine and x-rays of the knee all are negative. - Will give him PT/OT - Will give him IV fluid for hydration   3.  HTN/HLD - Patient is stated he does not take any medications at this time - He was prescribed lisinopril  and atorvastatin  in the past but not taking anymore - Monitor blood  pressure and add medication if needed - I will place him on hydralazine as needed for systolic blood pressure more than 160  4.  Type 2 diabetes - Patient is not on any medications at home - He will be placed on sliding scale  5.  AKI - His normal creatinine is 1.08 - He has creatinine 1.58 and BUN 32 - Will give him gentle hydration and monitor kidney function.       DVT prophylaxis: Lovenox Code Status: Full Code Family Communication: None available Disposition Plan: Home Consults called: None Admission status: Observation, Telemetry bed   Nena Rebel, MD [facility] 03/29/2024, 12:58 PM        [1] No Known Allergies  "

## 2024-03-29 NOTE — ED Provider Notes (Signed)
 "  [facility] Provider Note    Event Date/Time   First MD Initiated Contact with Patient 03/29/24 [phone]     (approximate)   History   Fall  Patient came in for fall with no LOC. He feels he has no strength to use his legs with sudden onset. Scrapes his legs and no pain. No drugs or alcohol use.    HPI Isaiah Rogers is a 78 y.o. male PMH diabetes, prior stroke, hypertension presents for evaluation after a fall - Patient reportedly had a witnessed fall earlier yesterday afternoon.  Since then has been unable to ambulate.  Notes that his legs feel very weak.  Able to stand.  Denies any dizziness.  Was ambulatory previously without difficulty. - Brought in by his cousin who provides collateral - Regarding fall yesterday, patient does not endorse any obvious head strike.  No LOC. - Has otherwise been in his usual state of health.  Endorses chronic weakness of the legs but notes they are notably more weak since his fall.  Per chart review, was seen in clinic on 02/12/2024.  Noted to have chronic leg numbness due to prior CVA and foraminal narrowing and anterolisthesis.  Reportedly with no relief with previous back surgery and has seen neurology for this problem.  Neurology notes reviewed, is followed by Dr. Lane in clinic.      Physical Exam   Triage Vital Signs: ED Triage Vitals  Encounter Vitals Group     BP 03/29/24 0225 97/60     Girls Systolic BP Percentile --      Girls Diastolic BP Percentile --      Boys Systolic BP Percentile --      Boys Diastolic BP Percentile --      Pulse Rate 03/29/24 0225 96     Resp 03/29/24 0225 18     Temp 03/29/24 0225 98.6 F (37 C)     Temp Source 03/29/24 0225 Oral     SpO2 03/29/24 0225 96 %     Weight 03/29/24 0226 140 lb (63.5 kg)     Height 03/29/24 0226 5' 4 (1.626 m)     Head Circumference --      Peak Flow --      Pain Score 03/29/24 0226 0     Pain Loc --      Pain Education --      Exclude from  Growth Chart --     Most recent vital signs: Vitals:   03/29/24 0225 03/29/24 0730  BP: 97/60 106/63  Pulse: 96 69  Resp: 18   Temp: 98.6 F (37 C)   SpO2: 96% 94%     General: Awake, no distress.  HEENT: Normocephalic, atraumatic, no midline neck pain, neck range of motion intact CV:  Good peripheral perfusion. RRR, RP 2+ Resp:  Normal effort. CTAB Abd:  No distention. Nontender to deep palpation throughout Back:  No midline tenderness Neuro:  Aox4, CN II-XII intact, FNF with dysmetria on left, finger taps fast b/l, 5/5 strength in bilateral finger grip EHL/FHL. BUE AG 10+ sec no drift, BLE AG 5+ sec no drift.  Unable to ambulate with heavy assistance though is able to stand. SILT.    ED Results / Procedures / Treatments   Labs (all labs ordered are listed, but only abnormal results are displayed) Labs Reviewed  RESP PANEL BY RT-PCR (RSV, FLU A&B, COVID)  RVPGX2 - Abnormal; Notable for the following components:  Result Value   Influenza A by PCR POSITIVE (*)    All other components within normal limits  CBC WITH DIFFERENTIAL/PLATELET - Abnormal; Notable for the following components:   WBC 13.8 (*)    RBC 3.76 (*)    Hemoglobin 11.1 (*)    HCT 33.7 (*)    Neutro Abs 12.1 (*)    Lymphs Abs 0.6 (*)    All other components within normal limits  COMPREHENSIVE METABOLIC PANEL WITH GFR - Abnormal; Notable for the following components:   Glucose, Bld 137 (*)    BUN 32 (*)    Creatinine, Ser 1.58 (*)    Calcium  8.8 (*)    AST 90 (*)    GFR, Estimated 45 (*)    All other components within normal limits  URINALYSIS, W/ REFLEX TO CULTURE (INFECTION SUSPECTED)     EKG  Ecg = sinus rhythm, rate 79, no gross ST elevation or depression, no significant repolarization abnormality, normal axis, normal intervals.  No evidence of ischemia nor arrhythmia on my interpretation.   RADIOLOGY  Radiology interpreted by myself and radiology reports reviewed.  No acute pathology  identified.   PROCEDURES:  Critical Care performed: No  Procedures   MEDICATIONS ORDERED IN ED: Medications  sodium chloride  0.9 % bolus 1,000 mL (1,000 mLs Intravenous New Bag/Given 03/29/24 0903)     IMPRESSION / MDM / ASSESSMENT AND PLAN / ED COURSE  I reviewed the triage vital signs and the nursing notes.                              DDX/MDM/AP: Differential diagnosis includes, but is not limited to, intracranial hemorrhage, recurrent CVA, consider worsening of known lumbar foraminal stenosis.  Consider exacerbation of chronic weakness in the setting of underlying infection-did cough a few times throughout my eval though otherwise denies any recent infectious symptoms.  Will screen for pneumonia, viral syndrome, UTI.  Do not clinically suspect intra-abdominal source at this time with benign exam and no associated symptoms.  Does have some abrasions to his left knee though no apparent tenderness there and not clearly a cellulitis --Will eval for underlying fracture.  Plan: - Labs - CT head, CT C-spine, CT L-spine - Reassess  Patient's presentation is most consistent with acute presentation with potential threat to life or bodily function.  The patient is on the cardiac monitor to evaluate for evidence of arrhythmia and/or significant heart rate changes.  ED course below.  Labs with AKI.  Patient did test positive for influenza, suspect this is the etiology of his generalized weakness and new inability to ambulate.  CT imaging stable from prior, some spinal canal stenosis though patient with no frank weakness of lower extremities on my eval, do not suspect acute spinal cord pathology at this time.  Received 1 L IV fluid in emergency department in setting of likely dehydration from influenza.  Given advanced age, generalized weakness, new inability ambulate patient was presented to hospitalist for admission.  Clinical Course as of 03/29/24 1108  Fri Mar 29, 2024  0836 CBC with  leukocytosis, mild anemia that is within baseline range [MM]  0837 CMP with AKI, also with mild bump in AST [MM]  0910 CTH: IMPRESSION: 1. No acute intracranial abnormality related to the reported trauma. 2. Chronic microvascular ischemic disease in bilateral cerebral hemispheres' deep and periventricular white matter. 3. Bilateral basal ganglia chronic lacunar infarctions. 4. Cerebral volume loss with concordant  ventricular enlargement. 5. Atherosclerotic calcifications within cavernous internal carotid arteries.   [MM]  0910 CTCSpine: IMPRESSION: 1. No evidence of fracture or acute traumatic injury. 2. 3 mm degenerative anterolisthesis at C5-6 with mild-to-moderate facet arthrosis bilaterally.   [MM]  0923 CT L spine:IMPRESSION: 1. Status post decompression laminectomies and bilateral posterolateral spinal fusion at L5-S1 with complete obliteration of the disc space, persistent grade 2 anterolisthesis, and mild-to-moderate central spinal canal stenosis. 2. At L4-5, mild central spinal canal stenosis, mild bilateral lateral recess stenosis, and moderate facet hypertrophic changes. Evaluation is limited by moderate spray artifact. 3. Mild abdominal aortic atherosclerotic calcification.   [MM]  1009 CXR: IMPRESSION: Low lung volumes without acute or active cardiopulmonary disease.   [MM]  1102 Influenza A By PCR(!): POSITIVE [MM]  1105 Evaluated, remains satting well on room air here.  I do believe his generalized weakness and new inability to ambulate is likely due to him testing positive for influenza.  Given this and his advanced age, we will plan for admission.  Did have normal lower extremity strength on my eval, not clinically concern for acute spinal cord compression at this time.  Hospitalist consult order placed.  Has provided urine sample as well, will send for testing.  [MM]  1106 XR L knee: IMPRESSION: Mild tricompartmental degenerative changes.   [MM]     Clinical Course User Index [MM] Clarine Ozell LABOR, MD     FINAL CLINICAL IMPRESSION(S) / ED DIAGNOSES   Final diagnoses:  Influenza A  Weakness  Difficulty walking  AKI (acute kidney injury)     Rx / DC Orders   ED Discharge Orders     None        Note:  This document was prepared using Dragon voice recognition software and may include unintentional dictation errors.   Clarine Ozell LABOR, MD 03/29/24 1108  "

## 2024-03-29 NOTE — ED Triage Notes (Signed)
 Patient came in for fall with no LOC. He feels he has no strength to use his legs with sudden onset. Scrapes his legs and no pain. No drugs or alcohol use.

## 2024-03-29 NOTE — Plan of Care (Signed)
   Problem: Education: Goal: Ability to describe self-care measures that may prevent or decrease complications (Diabetes Survival Skills Education) will improve Outcome: Progressing   Problem: Nutritional: Goal: Maintenance of adequate nutrition will improve Outcome: Progressing

## 2024-03-29 NOTE — Progress Notes (Signed)
 PHARMACY NOTE:  ANTIMICROBIAL RENAL DOSAGE ADJUSTMENT  Current antimicrobial regimen includes a mismatch between antimicrobial dosage and estimated renal function.  As per policy approved by the Pharmacy & Therapeutics and Medical Executive Committees, the antimicrobial dosage will be adjusted accordingly.  Current antimicrobial dosage:  Oseltamivir  75mg  BID  Indication: Influenza   Renal Function:  Estimated Creatinine Clearance: 32.8 mL/min (A) (by C-G formula based on SCr of 1.58 mg/dL (H)). []      On intermittent HD, scheduled: []      On CRRT    Antimicrobial dosage has been changed to:  Oseltamivir  30mg  BID  Additional comments: CrCL 30-27ml   Thank you for allowing pharmacy to be a part of this patient's care.  Estill CHRISTELLA Lutes, PharmD, BCPS Clinical Pharmacist 03/29/2024 12:38 PM

## 2024-03-29 NOTE — ED Notes (Signed)
 Informed RN bed assigned

## 2024-03-30 DIAGNOSIS — E119 Type 2 diabetes mellitus without complications: Secondary | ICD-10-CM | POA: Diagnosis present

## 2024-03-30 DIAGNOSIS — I10 Essential (primary) hypertension: Secondary | ICD-10-CM

## 2024-03-30 DIAGNOSIS — R7401 Elevation of levels of liver transaminase levels: Secondary | ICD-10-CM | POA: Insufficient documentation

## 2024-03-30 DIAGNOSIS — Y92008 Other place in unspecified non-institutional (private) residence as the place of occurrence of the external cause: Secondary | ICD-10-CM | POA: Diagnosis not present

## 2024-03-30 DIAGNOSIS — J101 Influenza due to other identified influenza virus with other respiratory manifestations: Secondary | ICD-10-CM | POA: Diagnosis present

## 2024-03-30 DIAGNOSIS — S0003XA Contusion of scalp, initial encounter: Secondary | ICD-10-CM | POA: Diagnosis present

## 2024-03-30 DIAGNOSIS — Z79899 Other long term (current) drug therapy: Secondary | ICD-10-CM | POA: Diagnosis not present

## 2024-03-30 DIAGNOSIS — M6282 Rhabdomyolysis: Secondary | ICD-10-CM

## 2024-03-30 DIAGNOSIS — Z7982 Long term (current) use of aspirin: Secondary | ICD-10-CM | POA: Diagnosis not present

## 2024-03-30 DIAGNOSIS — Y92009 Unspecified place in unspecified non-institutional (private) residence as the place of occurrence of the external cause: Secondary | ICD-10-CM | POA: Diagnosis not present

## 2024-03-30 DIAGNOSIS — N179 Acute kidney failure, unspecified: Secondary | ICD-10-CM | POA: Diagnosis present

## 2024-03-30 DIAGNOSIS — J111 Influenza due to unidentified influenza virus with other respiratory manifestations: Secondary | ICD-10-CM | POA: Diagnosis present

## 2024-03-30 DIAGNOSIS — W19XXXA Unspecified fall, initial encounter: Secondary | ICD-10-CM | POA: Diagnosis not present

## 2024-03-30 DIAGNOSIS — E860 Dehydration: Secondary | ICD-10-CM | POA: Diagnosis present

## 2024-03-30 DIAGNOSIS — I959 Hypotension, unspecified: Secondary | ICD-10-CM | POA: Diagnosis present

## 2024-03-30 DIAGNOSIS — W1830XA Fall on same level, unspecified, initial encounter: Secondary | ICD-10-CM | POA: Diagnosis present

## 2024-03-30 DIAGNOSIS — R54 Age-related physical debility: Secondary | ICD-10-CM | POA: Diagnosis present

## 2024-03-30 DIAGNOSIS — Z8673 Personal history of transient ischemic attack (TIA), and cerebral infarction without residual deficits: Secondary | ICD-10-CM | POA: Diagnosis not present

## 2024-03-30 DIAGNOSIS — G8929 Other chronic pain: Secondary | ICD-10-CM | POA: Diagnosis present

## 2024-03-30 DIAGNOSIS — Z87891 Personal history of nicotine dependence: Secondary | ICD-10-CM | POA: Diagnosis not present

## 2024-03-30 DIAGNOSIS — E785 Hyperlipidemia, unspecified: Secondary | ICD-10-CM | POA: Diagnosis present

## 2024-03-30 DIAGNOSIS — Z1152 Encounter for screening for COVID-19: Secondary | ICD-10-CM | POA: Diagnosis not present

## 2024-03-30 LAB — GLUCOSE, CAPILLARY
Glucose-Capillary: 103 mg/dL — ABNORMAL HIGH (ref 70–99)
Glucose-Capillary: 107 mg/dL — ABNORMAL HIGH (ref 70–99)
Glucose-Capillary: 108 mg/dL — ABNORMAL HIGH (ref 70–99)
Glucose-Capillary: 131 mg/dL — ABNORMAL HIGH (ref 70–99)

## 2024-03-30 LAB — COMPREHENSIVE METABOLIC PANEL WITH GFR
ALT: 50 U/L — ABNORMAL HIGH (ref 0–44)
AST: 219 U/L — ABNORMAL HIGH (ref 15–41)
Albumin: 3.4 g/dL — ABNORMAL LOW (ref 3.5–5.0)
Alkaline Phosphatase: 65 U/L (ref 38–126)
Anion gap: 9 (ref 5–15)
BUN: 32 mg/dL — ABNORMAL HIGH (ref 8–23)
CO2: 24 mmol/L (ref 22–32)
Calcium: 8 mg/dL — ABNORMAL LOW (ref 8.9–10.3)
Chloride: 108 mmol/L (ref 98–111)
Creatinine, Ser: 1.14 mg/dL (ref 0.61–1.24)
GFR, Estimated: >60 mL/min
Glucose, Bld: 106 mg/dL — ABNORMAL HIGH (ref 70–99)
Potassium: 3.8 mmol/L (ref 3.5–5.1)
Sodium: 142 mmol/L (ref 135–145)
Total Bilirubin: 0.6 mg/dL (ref 0.0–1.2)
Total Protein: 5.8 g/dL — ABNORMAL LOW (ref 6.5–8.1)

## 2024-03-30 LAB — CBC
HCT: 30.8 % — ABNORMAL LOW (ref 39.0–52.0)
Hemoglobin: 10.1 g/dL — ABNORMAL LOW (ref 13.0–17.0)
MCH: 29.3 pg (ref 26.0–34.0)
MCHC: 32.8 g/dL (ref 30.0–36.0)
MCV: 89.3 fL (ref 80.0–100.0)
Platelets: 204 K/uL (ref 150–400)
RBC: 3.45 MIL/uL — ABNORMAL LOW (ref 4.22–5.81)
RDW: 13.4 % (ref 11.5–15.5)
WBC: 7.2 K/uL (ref 4.0–10.5)
nRBC: 0 % (ref 0.0–0.2)

## 2024-03-30 LAB — MRSA NEXT GEN BY PCR, NASAL: MRSA by PCR Next Gen: NOT DETECTED

## 2024-03-30 LAB — STREP PNEUMONIAE URINARY ANTIGEN: Strep Pneumo Urinary Antigen: NEGATIVE

## 2024-03-30 LAB — PROTIME-INR
INR: 1.1 (ref 0.8–1.2)
Prothrombin Time: 14.6 s (ref 11.4–15.2)

## 2024-03-30 LAB — CK: Total CK: 7604 U/L — ABNORMAL HIGH (ref 49–397)

## 2024-03-30 MED ORDER — LACTATED RINGERS IV SOLN: INTRAVENOUS | Status: DC

## 2024-03-30 NOTE — Plan of Care (Signed)
  Problem: Education: Goal: Ability to describe self-care measures that may prevent or decrease complications (Diabetes Survival Skills Education) will improve Outcome: Progressing Goal: Individualized Educational Video(s) Outcome: Progressing   Problem: Nutritional: Goal: Maintenance of adequate nutrition will improve Outcome: Progressing Goal: Progress toward achieving an optimal weight will improve Outcome: Progressing   Problem: Skin Integrity: Goal: Risk for impaired skin integrity will decrease Outcome: Progressing   Problem: Activity: Goal: Risk for activity intolerance will decrease Outcome: Progressing

## 2024-03-30 NOTE — Progress Notes (Signed)
 " Progress Note   Patient: Isaiah Rogers FMW:[id number] DOB: 14-Mar-1946 DOA: 03/29/2024     0 DOS: the patient was seen and examined on 03/30/2024   Brief hospital course: Partly taken from H&P.  Charleton Deyoung is a pleasant 78 y.o. male with medical history significant for diabetes not on any medications, prior history of stroke, HTN, HLD, history of back surgery who came in for a unwitnessed fall at home yesterday afternoon.  He fell after work and was feeling weak.  On presentation borderline soft blood pressure at 97/60, positive influenza A PCR, leukocytosis at 13.8.  CT head with no intracranial abnormalities.  CT cervical spine with no evidence of fracture, CT lumbar spine showed s/p laminectomies and spinal fusion at L5-S1.  X-ray of the knee with no fractures.  Patient was given IV fluid, started on Tamiflu .  12/20: Vital stable, leukocytosis resolved, mild decrease in hemoglobin to 10.1 but all cell lines decreased so likely some dilutional effect.  Worsening transaminitis with AST 219, ALT 50, creatinine improved at 1.14.  CK at 7604.  Giving more IV fluid. PT recommending home health.  Assessment and Plan: * Influenza A H1N1 infection Having upper respiratory infection secondary to influenza A.  Chest x-ray was negative for any obvious pneumonia. -Continue with Tamiflu  -Continue with Zithromax  -Continue supportive care  Rhabdomyolysis Secondary to fall and staying on ground.  All imaging negative for any acute injuries. CK is 7382>> 7604 -Continue with IV fluid -Monitor CK  Diabetes mellitus without complication (HCC) Patient was not on any home medications.  A1c of 6.3 and CBG within goal. -Continue with SSI  Hypertension Blood pressure currently within goal. Patient was not taking any medications at home. - Continue to monitor -As needed hydralazine   Fall at home, initial encounter Generalized weakness likely secondary to influenza A infection. -PT is  recommending home health  AKI (acute kidney injury) Creatinine with some improvement 1.14 which is around baseline. -Monitor renal function -Avoid nephrotoxins  Transaminitis Likely secondary to influenza A. - Monitor liver function   Subjective: Patient was seen and examined today.  Still feeling weak.  Appetite remained poor.  Spanish speaking gentleman-was seen with the help of nephew as interpreter.  Physical Exam: Vitals:   03/29/24 2230 03/30/24 0555 03/30/24 0746 03/30/24 1545  BP: (!) 140/74 120/69 118/70 137/74  Pulse: 82 79 81 80  Resp: 17 18 19 19   Temp: 98.8 F (37.1 C) 98.3 F (36.8 C) 98.4 F (36.9 C) 98.5 F (36.9 C)  TempSrc: Oral     SpO2: 95% 95% 94% 97%  Weight:      Height:       General.  Frail elderly man, in no acute distress. Pulmonary.  Lungs clear bilaterally, normal respiratory effort. CV.  Regular rate and rhythm, no JVD, rub or murmur. Abdomen.  Soft, nontender, nondistended, BS positive. CNS.  Alert and oriented .  No focal neurologic deficit. Extremities.  No edema,  pulses intact and symmetrical.   Data Reviewed: Prior data reviewed  Family Communication: Discussed with multiple family members at bedside  Disposition: Status is: Inpatient Remains inpatient appropriate because: Severity of illness  Planned Discharge Destination: Home with Home Health  DVT prophylaxis.  Subcu heparin  Time spent: 50 minutes  This record has been created using Conservation officer, historic buildings. Errors have been sought and corrected,but may not always be located. Such creation errors do not reflect on the standard of care.   Author: Amaryllis Dare, MD 03/30/2024 6:10 PM  For on call review [email or web address].  "

## 2024-03-30 NOTE — Evaluation (Signed)
 Occupational Therapy Evaluation Patient Details Name: Isaiah Rogers MRN: [id number] DOB: 04/19/1945 Today's Date: 03/30/2024   History of Present Illness   Isaiah Rogers is a pleasant 78 y.o. male with medical history significant for diabetes not on any medications, prior history of stroke, HTN, HLD, history of back surgery who came in for a unwitnessed fall at home yesterday afternoon.  He stated that he came back from work and fell at his house.  He was alone.  He tried to get up from the floor took 1 hour and then went right walk around he was very weak and came to the emergency room for evaluation.  Patient's cousin was here in the emergency room and stated that patient was at her normal state of health until yesterday but he has some chronic weakness on his legs but they are weak at this point.  He has a history of back pain but did not get better with the surgery.  Patient denies any fever, cough, shortness of breath only his complaint was generalized weakness and not able to ambulate.  He is Spanish-speaking and I interviewed with him with the help of interpreter.     Clinical Impressions  Upon OT arrival patient supine in bed..  Girlfriend present.  Assist with translation.  Patient willing to work with OT.  Patient lives alone.  She brings meals for him at times.  But patient is independent in ADLs and drive, work part-time at [employer] and does his own laundry.  Patient has friends that he can go to at discharge until he gets stronger.  Patient willing to get up with supervision with min verbal cueing to edge of bed.  Needed verbal cueing for hand placement upon sit to stand using rolling walker with supervision/ contact-guard to bathroom.  Sit and stand and transfers independent with supervision.  Patient independent and lower body dressing.  Only limited by fatigue and activity tolerance because of flu symptoms.  Reports some discomfort and pain at bilateral knees and shins where he  crawled on his knees.  No number provided.  No further OT services needed in this setting.     If plan is discharge home, recommend the following:     Home health physical therapy    Functional Status Assessment         Equipment Recommendations         Recommendations for Other Services         Precautions/Restrictions   Precautions Precautions: Fall Restrictions Weight Bearing Restrictions Per Provider Order: No     Mobility Bed Mobility Overal bed mobility: Independent Bed Mobility: Supine to Sit     Supine to sit: Supervision     General bed mobility comments: Verbal cueing    Transfers   Equipment used: Rolling walker (2 wheels)   Sit to Stand: Supervision, Contact guard assist           General transfer comment: Patient needed verbal cueing for hand placement to not pull on walker.      Balance Overall balance assessment: Independent Sitting-balance support: No upper extremity supported, Feet supported Sitting balance-Leahy Scale: Good     Standing balance support: Single extremity supported Standing balance-Leahy Scale: Fair                             ADL either performed or assessed with clinical judgement   ADL  General ADL Comments: Patient independent in lower body and upper body bathing and dressing.  She is limited by fatigue.  Grooming and eating independent.  Ambulating with rolling walker to bathroom and transfers independent with supervision     Vision Baseline Vision/History: 1 Wears glasses       Perception         Praxis         Pertinent Vitals/Pain Pain Assessment Pain Assessment:  (On his shins and knees but no number provided)     Extremity/Trunk Assessment Upper Extremity Assessment Upper Extremity Assessment: Generalized weakness   Lower Extremity Assessment Lower Extremity Assessment: Defer to PT evaluation   Cervical /  Trunk Assessment Cervical / Trunk Assessment: Normal   Communication Communication Communication: No apparent difficulties   Cognition Arousal: Alert Behavior During Therapy: WFL for tasks assessed/performed                                 Following commands: Intact       Cueing  General Comments   Cueing Techniques: Verbal cues      Exercises     Shoulder Instructions      Home Living Family/patient expects to be discharged to:: Private residence Living Arrangements: Alone Available Help at Discharge: Friend(s) (Per friend he can stay with friends at discharge until he gets better) Type of Home: House             Bathroom Shower/Tub: Tub/shower unit;Curtain   Bathroom Toilet: Standard     Home Equipment: None   Additional Comments: girl friend in room used for [employer]. Girl confirms that he lives aone per previous chart review-but he has friends that he can go and stay with at discharge until he gets stronger      Prior Functioning/Environment Prior Level of Function : Independent/Modified Independent             Mobility Comments: history of fall; works part-time at [employer] ADLs Comments: indepedent for all ADLs with assistance from girl friend; states that he does is own laudry, cleaning, dressing and drives occasionally    OT Problem List:     OT Treatment/Interventions:        OT Goals(Current goals can be found in the care plan section)       OT Frequency:       Co-evaluation              AM-PAC OT 6 Clicks Daily Activity     Outcome Measure                 End of Session    Activity Tolerance:   Patient left:                     Time:  -    Charges:       Ancel Peters OTR/L;CLT 03/30/2024, 1:49 PM

## 2024-03-30 NOTE — Assessment & Plan Note (Signed)
 Secondary to fall and staying on ground.  All imaging negative for any acute injuries. CK is 7382>> 7604 -Continue with IV fluid -Monitor CK

## 2024-03-30 NOTE — Assessment & Plan Note (Signed)
 Blood pressure currently within goal. Patient was not taking any medications at home. - Continue to monitor -As needed hydralazine 

## 2024-03-30 NOTE — Assessment & Plan Note (Signed)
 Creatinine with some improvement 1.14 which is around baseline. -Monitor renal function -Avoid nephrotoxins

## 2024-03-30 NOTE — Hospital Course (Addendum)
 Partly taken from H&P.  Isaiah Rogers is a pleasant 78 y.o. male with medical history significant for diabetes not on any medications, prior history of stroke, HTN, HLD, history of back surgery who came in for a unwitnessed fall at home yesterday afternoon.  He fell after work and was feeling weak.  On presentation borderline soft blood pressure at 97/60, positive influenza A PCR, leukocytosis at 13.8.  CT head with no intracranial abnormalities.  CT cervical spine with no evidence of fracture, CT lumbar spine showed s/p laminectomies and spinal fusion at L5-S1.  X-ray of the knee with no fractures.  Patient was given IV fluid, started on Tamiflu .  12/20: Vital stable, leukocytosis resolved, mild decrease in hemoglobin to 10.1 but all cell lines decreased so likely some dilutional effect.  Worsening transaminitis with AST 219, ALT 50, creatinine improved at 1.14.  CK at 7604.  Giving more IV fluid. PT recommending home health.  12/21: Remained hemodynamically stable.  Improving CK now at 5682.  Stable transaminitis.  Clinically seems much improved.  He is being discharged on 5 more doses of tamoxifen to complete the course.  2 more doses of Zithromax .  Patient was advised to keep himself well-hydrated and keep holding statin until seen by his primary care provider and they can repeat his CMP and CK levels.  Home health services was ordered as recommended by PT.  Patient should restarted taking lisinopril -counseling was provided as his blood pressure was mildly elevated.  He need to have a close follow-up with primary care provider for further assistance.  Patient will continue on current medications and follow-up with his primary care provider for further management.

## 2024-03-30 NOTE — Assessment & Plan Note (Signed)
 Having upper respiratory infection secondary to influenza A.  Chest x-ray was negative for any obvious pneumonia. -Continue with Tamiflu  -Continue with Zithromax  -Continue supportive care

## 2024-03-30 NOTE — Assessment & Plan Note (Signed)
 Generalized weakness likely secondary to influenza A infection. -PT is recommending home health

## 2024-03-30 NOTE — Evaluation (Signed)
 Physical Therapy Evaluation Patient Details Name: Isaiah Rogers MRN: [id number] DOB: 12/10/1945 Today's Date: 03/30/2024  History of Present Illness  [name] is a pleasant 78 y.o. male with medical history significant for diabetes not on any medications, prior history of stroke, HTN, HLD, history of back surgery who came in for a unwitnessed fall at home yesterday afternoon.  He stated that he came back from work and fell at his house.  He was alone.  He tried to get up from the floor took 1 hour and then went right walk around he was very weak and came to the emergency room for evaluation.  Patient's cousin was here in the emergency room and stated that patient was at her normal state of health until yesterday but he has some chronic weakness on his legs but they are weak at this point.  He has a history of back pain but did not get better with the surgery.  Patient denies any fever, cough, shortness of breath only his complaint was generalized weakness and not able to ambulate.  He is Spanish-speaking and I interviewed with him with the help of interpreter.  Clinical Impression  Patient noted to be in supine position at PT arrival in room, for an initial PT evaluation due to a decline in functional status, with baseline mobility reported as independent with history of falls, and currently requiring CGA  for room ambulation. The patient is A&O x 4, presenting with good willingness to work with PT and goals of going home. The patient resides in a house and lives alone with limited family/friend support. There are 2 STE inside the residence.  Gait was assessed with RW, limited by weakness and lateral weight shift. Gait mechanic observations noted antalgic gait pattern with lateral sway. The overall clinical impression is that the patient presents with mild to moderate mobility limitations secondary to flu syndrome.      If plan is discharge home, recommend the following: A little help with walking  and/or transfers;Help with stairs or ramp for entrance;Assist for transportation   Can travel by private vehicle        Equipment Recommendations Rolling walker (2 wheels)  Recommendations for Other Services       Functional Status Assessment Patient has had a recent decline in their functional status and demonstrates the ability to make significant improvements in function in a reasonable and predictable amount of time.     Precautions / Restrictions Precautions Precautions: Fall Restrictions Weight Bearing Restrictions Per Provider Order: No      Mobility  Bed Mobility Overal bed mobility: Needs Assistance Bed Mobility: Supine to Sit     Supine to sit: Min assist, Contact guard          Transfers Overall transfer level: Needs assistance Equipment used: Rolling walker (2 wheels) Transfers: Sit to/from Stand Sit to Stand: Min assist, Contact guard assist                Ambulation/Gait Ambulation/Gait assistance: Supervision, Contact guard assist Gait Distance (Feet): 40 Feet Assistive device: Rolling walker (2 wheels) Gait Pattern/deviations: Step-through pattern, Narrow base of support Gait velocity: decreased     General Gait Details: vc for safety with girl friend in room  Stairs            Wheelchair Mobility     Tilt Bed    Modified Rankin (Stroke Patients Only)       Balance Overall balance assessment: Needs assistance Sitting-balance support: Feet unsupported Sitting  balance-Leahy Scale: Good       Standing balance-Leahy Scale: Poor                               Pertinent Vitals/Pain Pain Assessment Pain Assessment: No/denies pain    Home Living Family/patient expects to be discharged to:: Private residence Living Arrangements: Alone;Other (Comment) (girl firend in room states that she brings him food everyday) Available Help at Discharge: Friend(s) Type of Home: House Home Access: Stairs to enter;Level  entry Entrance Stairs-Rails: Right Entrance Stairs-Number of Steps: 2 Alternate Level Stairs-Number of Steps: 6 Home Layout: Two level Home Equipment: None Additional Comments: girl friend in room used for interpeter. Girl confirms that he lives aone per previous chart review    Prior Function Prior Level of Function : Independent/Modified Independent             Mobility Comments: history of fall; works part-time at [employer] ADLs Comments: indepedent for all ADLs with assistance from girl friend; states that he does is own laudry, cleaning, dressing and drives occasionally     Extremity/Trunk Assessment   Upper Extremity Assessment Upper Extremity Assessment: Generalized weakness    Lower Extremity Assessment Lower Extremity Assessment: Generalized weakness    Cervical / Trunk Assessment Cervical / Trunk Assessment: Normal  Communication   Communication Communication: No apparent difficulties    Cognition Arousal: Alert Behavior During Therapy: WFL for tasks assessed/performed   PT - Cognitive impairments: No apparent impairments, Orientation   Orientation impairments: Place                     Following commands: Intact       Cueing Cueing Techniques: Verbal cues     General Comments      Exercises     Assessment/Plan    PT Assessment Patient needs continued PT services  PT Problem List Decreased strength;Decreased activity tolerance;Decreased balance;Decreased mobility       PT Treatment Interventions DME instruction;Gait training;Stair training;Functional mobility training;Therapeutic activities;Therapeutic exercise;Balance training;Patient/family education;Neuromuscular re-education    PT Goals (Current goals can be found in the Care Plan section)  Acute Rehab PT Goals Patient Stated Goal: Pt wants to go home PT Goal Formulation: With patient/family Time For Goal Achievement: 04/27/24 Potential to Achieve Goals: Good    Frequency  Min 2X/week     Co-evaluation               AM-PAC PT 6 Clicks Mobility  Outcome Measure Help needed turning from your back to your side while in a flat bed without using bedrails?: A Little Help needed moving from lying on your back to sitting on the side of a flat bed without using bedrails?: A Little Help needed moving to and from a bed to a chair (including a wheelchair)?: A Little Help needed standing up from a chair using your arms (e.g., wheelchair or bedside chair)?: A Little Help needed to walk in hospital room?: A Little Help needed climbing 3-5 steps with a railing? : A Little 6 Click Score: 18    End of Session Equipment Utilized During Treatment: Gait belt Activity Tolerance: Patient tolerated treatment well Patient left: in chair;with chair alarm set Nurse Communication: Mobility status PT Visit Diagnosis: Other abnormalities of gait and mobility (R26.89);Muscle weakness (generalized) (M62.81);History of falling (Z91.81)    Time: 9154-9089 PT Time Calculation (min) (ACUTE ONLY): 25 min   Charges:   PT Evaluation $PT Eval  Low Complexity: 1 Low   PT General Charges $$ ACUTE PT VISIT: 1 Visit         Sherlean Lesches DPT, PT    Sherlean A Zyriah Mask 03/30/2024, 10:08 AM

## 2024-03-30 NOTE — Assessment & Plan Note (Signed)
 Patient was not on any home medications.  A1c of 6.3 and CBG within goal. -Continue with SSI

## 2024-03-30 NOTE — Assessment & Plan Note (Signed)
 Likely secondary to influenza A. - Monitor liver function

## 2024-03-31 ENCOUNTER — Emergency Department
Admission: EM | Admit: 2024-03-31 | Discharge: 2024-03-31 | Disposition: A | Discharge status: Home / Self Care | Attending: Emergency Medicine | Admitting: Emergency Medicine

## 2024-03-31 ENCOUNTER — Other Ambulatory Visit: Payer: Self-pay

## 2024-03-31 ENCOUNTER — Emergency Department

## 2024-03-31 DIAGNOSIS — E119 Type 2 diabetes mellitus without complications: Secondary | ICD-10-CM | POA: Diagnosis not present

## 2024-03-31 DIAGNOSIS — I10 Essential (primary) hypertension: Secondary | ICD-10-CM | POA: Diagnosis not present

## 2024-03-31 DIAGNOSIS — Z8673 Personal history of transient ischemic attack (TIA), and cerebral infarction without residual deficits: Secondary | ICD-10-CM | POA: Insufficient documentation

## 2024-03-31 DIAGNOSIS — M6282 Rhabdomyolysis: Secondary | ICD-10-CM | POA: Diagnosis not present

## 2024-03-31 DIAGNOSIS — Z603 Acculturation difficulty: Secondary | ICD-10-CM | POA: Insufficient documentation

## 2024-03-31 DIAGNOSIS — Z23 Encounter for immunization: Secondary | ICD-10-CM | POA: Diagnosis not present

## 2024-03-31 DIAGNOSIS — Y92009 Unspecified place in unspecified non-institutional (private) residence as the place of occurrence of the external cause: Secondary | ICD-10-CM | POA: Diagnosis not present

## 2024-03-31 DIAGNOSIS — N179 Acute kidney failure, unspecified: Secondary | ICD-10-CM | POA: Diagnosis not present

## 2024-03-31 DIAGNOSIS — J101 Influenza due to other identified influenza virus with other respiratory manifestations: Secondary | ICD-10-CM | POA: Diagnosis not present

## 2024-03-31 DIAGNOSIS — W19XXXA Unspecified fall, initial encounter: Secondary | ICD-10-CM | POA: Diagnosis not present

## 2024-03-31 DIAGNOSIS — S0003XA Contusion of scalp, initial encounter: Secondary | ICD-10-CM | POA: Diagnosis present

## 2024-03-31 DIAGNOSIS — J069 Acute upper respiratory infection, unspecified: Secondary | ICD-10-CM | POA: Insufficient documentation

## 2024-03-31 DIAGNOSIS — S0081XA Abrasion of other part of head, initial encounter: Secondary | ICD-10-CM | POA: Insufficient documentation

## 2024-03-31 DIAGNOSIS — R7401 Elevation of levels of liver transaminase levels: Secondary | ICD-10-CM | POA: Diagnosis not present

## 2024-03-31 LAB — CBC
HCT: 28.7 % — ABNORMAL LOW (ref 39.0–52.0)
Hemoglobin: 9.6 g/dL — ABNORMAL LOW (ref 13.0–17.0)
MCH: 29.7 pg (ref 26.0–34.0)
MCHC: 33.4 g/dL (ref 30.0–36.0)
MCV: 88.9 fL (ref 80.0–100.0)
Platelets: 201 K/uL (ref 150–400)
RBC: 3.23 MIL/uL — ABNORMAL LOW (ref 4.22–5.81)
RDW: 13.2 % (ref 11.5–15.5)
WBC: 4.2 K/uL (ref 4.0–10.5)
nRBC: 0 % (ref 0.0–0.2)

## 2024-03-31 LAB — COMPREHENSIVE METABOLIC PANEL WITH GFR
ALT: 63 U/L — ABNORMAL HIGH (ref 0–44)
AST: 228 U/L — ABNORMAL HIGH (ref 15–41)
Albumin: 3.3 g/dL — ABNORMAL LOW (ref 3.5–5.0)
Alkaline Phosphatase: 58 U/L (ref 38–126)
Anion gap: 9 (ref 5–15)
BUN: 20 mg/dL (ref 8–23)
CO2: 25 mmol/L (ref 22–32)
Calcium: 8.2 mg/dL — ABNORMAL LOW (ref 8.9–10.3)
Chloride: 109 mmol/L (ref 98–111)
Creatinine, Ser: 0.87 mg/dL (ref 0.61–1.24)
GFR, Estimated: >60 mL/min
Glucose, Bld: 94 mg/dL (ref 70–99)
Potassium: 3.5 mmol/L (ref 3.5–5.1)
Sodium: 143 mmol/L (ref 135–145)
Total Bilirubin: 0.5 mg/dL (ref 0.0–1.2)
Total Protein: 5.7 g/dL — ABNORMAL LOW (ref 6.5–8.1)

## 2024-03-31 LAB — CK: Total CK: 5682 U/L — ABNORMAL HIGH (ref 49–397)

## 2024-03-31 LAB — GLUCOSE, CAPILLARY: Glucose-Capillary: 96 mg/dL (ref 70–99)

## 2024-03-31 MED ORDER — OSELTAMIVIR PHOSPHATE 75 MG PO CAPS
75.0000 mg | ORAL_CAPSULE | Freq: Two times a day (BID) | ORAL | Status: DC
  Administered 2024-03-31: 75 mg via ORAL
  Filled 2024-03-31: qty 1

## 2024-03-31 MED ORDER — OSELTAMIVIR PHOSPHATE 75 MG PO CAPS
75.0000 mg | ORAL_CAPSULE | Freq: Two times a day (BID) | ORAL | 0 refills | Status: AC
  Filled 2024-03-31: qty 5, 3d supply, fill #0

## 2024-03-31 MED ORDER — ACETAMINOPHEN 325 MG PO TABS
650.0000 mg | ORAL_TABLET | Freq: Four times a day (QID) | ORAL | 0 refills | Status: AC | PRN
  Filled 2024-03-31: qty 100, 13d supply, fill #0

## 2024-03-31 MED ORDER — TETANUS-DIPHTH-ACELL PERTUSSIS 5-2-15.5 LF-MCG/0.5 IM SUSP
0.5000 mL | Freq: Once | INTRAMUSCULAR | Status: AC
  Administered 2024-03-31: 0.5 mL via INTRAMUSCULAR
  Filled 2024-03-31: qty 0.5

## 2024-03-31 MED ORDER — AZITHROMYCIN 500 MG PO TABS
500.0000 mg | ORAL_TABLET | Freq: Every day | ORAL | 0 refills | Status: AC
  Filled 2024-03-31: qty 2, 2d supply, fill #0
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
## 2024-03-31 NOTE — TOC Transition Note (Signed)
 Transition of Care [facility]) - Discharge Note   Patient Details  Name: Isaiah Rogers MRN: [id number] Date of Birth: 10/31/1945  [facility] CM/SW Contact:  Jose Alleyne L Kagan Mutchler, LCSW Phone Number: 03/31/2024, 11:48 AM   Clinical Narrative:     Discharge summary is in. Orders for Home Health were placed. CSW met with patient. Family was at bedside. Gladis, relative, interpreted with patients permission.   Home Health options discussed. Family advised that they are declining home health until patients brother is able to visit from [place] . They advised that the plan is for patient to move to [place]  where there is family that can manage his needs. CSW advised that if that plan is not successful or there is a change, patient can follow-up with PCP and PCP can order/coordinate home health. Family understood. No DME needs.   TOC signing off.         Patient Goals and CMS Choice            Discharge Placement                       Discharge Plan and Services Additional resources added to the After Visit Summary for                                       Social Drivers of Health (SDOH) Interventions SDOH Screenings   Food Insecurity: No Food Insecurity (02/12/2024)   Received [facility]  Housing: Low Risk  (02/12/2024)   Received from [facility]  Transportation Needs: No Transportation Needs (02/12/2024)   Received from [facility]  Utilities: Not At Risk (02/12/2024)   Received from [facility]  Financial Resource Strain: Low Risk  (02/12/2024)   Received from [facility]  Tobacco Use: Medium Risk (02/12/2024)   Received from [facility]     Readmission Risk Interventions     No data to display

## 2024-03-31 NOTE — Discharge Summary (Signed)
 " Physician Discharge Summary   Patient: Isaiah Rogers MRN: [id number] DOB: Aug 17, 1945  Admit date:     03/29/2024  Discharge date: 03/31/2024  Discharge Physician: Amaryllis Dare   PCP: [facility]   Recommendations at discharge:  Please obtain CBC, CMP and CK levels on follow-up Please resume statin once CMP and CK levels normalized Patient need counseling to stay compliant with his medications. Please ensure completion of Tamiflu  and Zithromax  Follow-up with primary care provider within a week  Discharge Diagnoses: Principal Problem:   Influenza A H1N1 infection Active Problems:   Rhabdomyolysis   Diabetes mellitus without complication (HCC)   Hypertension   Fall at home, initial encounter   AKI (acute kidney injury)   Flu syndrome   Chronic back pain   Transaminitis   Hospital Course: Partly taken from H&P.  Isaiah Rogers is a pleasant 78 y.o. male with medical history significant for diabetes not on any medications, prior history of stroke, HTN, HLD, history of back surgery who came in for a unwitnessed fall at home yesterday afternoon.  He fell after work and was feeling weak.  On presentation borderline soft blood pressure at 97/60, positive influenza A PCR, leukocytosis at 13.8.  CT head with no intracranial abnormalities.  CT cervical spine with no evidence of fracture, CT lumbar spine showed s/p laminectomies and spinal fusion at L5-S1.  X-ray of the knee with no fractures.  Patient was given IV fluid, started on Tamiflu .  12/20: Vital stable, leukocytosis resolved, mild decrease in hemoglobin to 10.1 but all cell lines decreased so likely some dilutional effect.  Worsening transaminitis with AST 219, ALT 50, creatinine improved at 1.14.  CK at 7604.  Giving more IV fluid. PT recommending home health.  12/21: Remained hemodynamically stable.  Improving CK now at 5682.  Stable transaminitis.  Clinically seems much improved.  He is being discharged on 5 more  doses of tamoxifen to complete the course.  2 more doses of Zithromax .  Patient was advised to keep himself well-hydrated and keep holding statin until seen by his primary care provider and they can repeat his CMP and CK levels.  Home health services was ordered as recommended by PT.  Patient should restarted taking lisinopril -counseling was provided as his blood pressure was mildly elevated.  He need to have a close follow-up with primary care provider for further assistance.  Patient will continue on current medications and follow-up with his primary care provider for further management.  Assessment and Plan: * Influenza A H1N1 infection Having upper respiratory infection secondary to influenza A.  Chest x-ray was negative for any obvious pneumonia. -Continue with Tamiflu  to complete a 5-day course -Continue with Zithromax  for 2 more days -Continue supportive care  Rhabdomyolysis Secondary to fall and staying on ground.  All imaging negative for any acute injuries. CK is 7382>> [phone] -Encourage hydration -Monitor CK  Diabetes mellitus without complication (HCC) Patient was not on any home medications.  A1c of 6.3 and CBG within goal. - Need to have a close follow-up with PCP  Hypertension Blood pressure mildly elevated Patient was not taking any medications at home. - Continue to monitor -As needed hydralazine  - Patient was supposed to take lisinopril -need counseling to stay compliant.  Fall at home, initial encounter Generalized weakness likely secondary to influenza A infection. -PT is recommending home health-ordered  AKI (acute kidney injury) Resolved with IV fluid  Transaminitis Likely secondary to influenza A. - Monitor liver function  Consultants: None Procedures performed:  None Disposition: Home health Diet recommendation:  Cardiac and Carb modified diet DISCHARGE MEDICATION: Allergies as of 03/31/2024   No Known Allergies      Medication List      PAUSE taking these medications    atorvastatin  40 MG tablet Wait to take this until your doctor or other care provider tells you to start again. Commonly known as: LIPITOR Take 1 tablet (40 mg total) by mouth daily.       STOP taking these medications    gabapentin  300 MG capsule Commonly known as: NEURONTIN    rOPINIRole  0.5 MG tablet Commonly known as: REQUIP        TAKE these medications    acetaminophen  325 MG tablet Commonly known as: TYLENOL  Take 2 tablets (650 mg total) by mouth every 6 (six) hours as needed for mild pain (pain score 1-3) or fever (or Fever >/= 101).   aspirin  81 MG chewable tablet Chew 1 tablet (81 mg total) by mouth daily.   azithromycin  500 MG tablet Commonly known as: ZITHROMAX  Take 1 tablet (500 mg total) by mouth daily for 2 days.   ferrous sulfate  325 (65 FE) MG tablet Take 325 mg by mouth daily with breakfast.   lisinopril  20 MG tablet Commonly known as: ZESTRIL  Take 1 tablet (20 mg total) by mouth daily.   oseltamivir  75 MG capsule Commonly known as: TAMIFLU  Take 1 capsule (75 mg total) by mouth 2 (two) times daily for 5 doses.   Salonpas  Pads Apply 1 each topically daily as needed (back pain).   VITAMIN B12 PO Take 1 tablet by mouth at bedtime.        Discharge Exam: Filed Weights   03/29/24 0226  Weight: 63.5 kg   General.  Frail elderly man, in no acute distress. Pulmonary.  Lungs clear bilaterally, normal respiratory effort. CV.  Regular rate and rhythm, no JVD, rub or murmur. Abdomen.  Soft, nontender, nondistended, BS positive. CNS.  Alert and oriented .  No focal neurologic deficit. Extremities.  No edema,  pulses intact and symmetrical.   Condition at discharge: stable  The results of significant diagnostics from this hospitalization (including imaging, microbiology, ancillary and laboratory) are listed below for reference.   Imaging Studies: DG Knee Complete 4 Views Left Result Date:  03/29/2024 CLINICAL DATA:  Operation to left knee. EXAM: LEFT KNEE - COMPLETE 4+ VIEW COMPARISON:  None Available. FINDINGS: No evidence of fracture, dislocation, or joint effusion. Mild tricompartmental degenerative changes are noted. There is mild vascular calcification. Soft tissues are otherwise unremarkable. IMPRESSION: Mild tricompartmental degenerative changes. Electronically Signed   By: Suzen Dials M.D.   On: 03/29/2024 10:08   DG Chest Portable 1 View Result Date: 03/29/2024 CLINICAL DATA:  Weakness and leukocytosis. EXAM: PORTABLE CHEST 1 VIEW COMPARISON:  None Available. FINDINGS: The heart size and mediastinal contours are within normal limits. Low lung volumes are noted. No acute infiltrate, pleural effusion or pneumothorax is identified. A 10 mm diameter calcified nodule is seen overlying the left lung base. Multilevel degenerative changes are present throughout the thoracic spine. IMPRESSION: Low lung volumes without acute or active cardiopulmonary disease. Electronically Signed   By: Suzen Dials M.D.   On: 03/29/2024 10:04   CT Lumbar Spine Wo Contrast Result Date: 03/29/2024 EXAM: CT OF THE LUMBAR SPINE WITHOUT CONTRAST 03/29/2024 08:46:59 AM TECHNIQUE: CT of the lumbar spine was performed without the administration of intravenous contrast. Multiplanar reformatted images are provided for review. Automated exposure control, iterative reconstruction, and/or weight based  adjustment of the mA/kV was utilized to reduce the radiation dose to as low as reasonably achievable. COMPARISON: MRI of the lumbar spine dated 09/01/2014. CLINICAL HISTORY: History of foraminal stenosis causing leg weakness, new inability to walk, recent fall. FINDINGS: BONES AND ALIGNMENT: Normal vertebral body heights. No acute fracture or suspicious bone lesion. Persistent grade 2 anterolisthesis at L5-S1. Decompression laminectomies and bilateral posterolateral spinal fusion at L5-S1. DEGENERATIVE CHANGES: At  L5-S1, there is complete obliteration of the disc space, which is fused. There is mild-to-moderate central spinal canal stenosis at this level. At L4-L5, there is mild central spinal canal stenosis and mild bilateral lateral recess stenosis. There are moderate facet hypertrophic changes. At L4-L5, there is some moderate amount of streak artifact. SOFT TISSUES: Mild calcification within the abdominal aorta. No acute abnormality. IMPRESSION: 1. Status post decompression laminectomies and bilateral posterolateral spinal fusion at L5-S1 with complete obliteration of the disc space, persistent grade 2 anterolisthesis, and mild-to-moderate central spinal canal stenosis. 2. At L4-5, mild central spinal canal stenosis, mild bilateral lateral recess stenosis, and moderate facet hypertrophic changes. Evaluation is limited by moderate spray artifact. 3. Mild abdominal aortic atherosclerotic calcification. Electronically signed by: Evalene Coho MD 03/29/2024 09:08 AM EST RP Workstation: [id number]   CT Cervical Spine Wo Contrast Result Date: 03/29/2024 EXAM: CT CERVICAL SPINE WITHOUT CONTRAST 03/29/2024 08:46:59 AM TECHNIQUE: CT of the cervical spine was performed without the administration of intravenous contrast. Multiplanar reformatted images are provided for review. Automated exposure control, iterative reconstruction, and/or weight based adjustment of the mA/kV was utilized to reduce the radiation dose to as low as reasonably achievable. COMPARISON: None available. CLINICAL HISTORY: trauma, difficulty walking FINDINGS: BONES AND ALIGNMENT: There is no evidence of fracture or acute traumatic injury. DEGENERATIVE CHANGES: There is a 3 mm of degenerative anterolisthesis present at C5-C6. There is mild-to-moderate facet arthrosis present bilaterally. SOFT TISSUES: No prevertebral soft tissue swelling. IMPRESSION: 1. No evidence of fracture or acute traumatic injury. 2. 3 mm degenerative anterolisthesis at C5-6 with  mild-to-moderate facet arthrosis bilaterally. Electronically signed by: Evalene Coho MD 03/29/2024 08:56 AM EST RP Workstation: [id number]   CT HEAD WO CONTRAST ( ) Result Date: 03/29/2024 EXAM: CT HEAD WITHOUT CONTRAST 03/29/2024 08:46:59 AM TECHNIQUE: CT of the head was performed without the administration of intravenous contrast. Automated exposure control, iterative reconstruction, and/or weight based adjustment of the mA/kV was utilized to reduce the radiation dose to as low as reasonably achievable. COMPARISON: CT angiogram of the head dated 07/23/2021 and CT of the head dated 07/22/2021. CLINICAL HISTORY: trauma, difficulty walking FINDINGS: BRAIN AND VENTRICLES: No acute hemorrhage. No evidence of acute infarct. Patchy and confluent decreased attenuation throughout bilateral cerebral hemispheres' deep and periventricular white matter, compatible with chronic microvascular ischemic disease. Bilateral basal ganglia chronic lacunar infarctions. Cerebral ventricle sizes concordant with cerebral volume loss. No extra-axial collection. No mass effect or midline shift. Atherosclerotic calcifications within cavernous internal carotid arteries. ORBITS: No acute abnormality. SINUSES: No acute abnormality. SOFT TISSUES AND SKULL: No acute soft tissue abnormality. No skull fracture. IMPRESSION: 1. No acute intracranial abnormality related to the reported trauma. 2. Chronic microvascular ischemic disease in bilateral cerebral hemispheres' deep and periventricular white matter. 3. Bilateral basal ganglia chronic lacunar infarctions. 4. Cerebral volume loss with concordant ventricular enlargement. 5. Atherosclerotic calcifications within cavernous internal carotid arteries. Electronically signed by: Evalene Coho MD 03/29/2024 08:51 AM EST RP Workstation: [id number]    Microbiology: Results for orders placed or performed during the hospital encounter of 03/29/24  Resp panel  by RT-PCR (RSV, Flu A&B, Covid)  Anterior Nasal Swab     Status: Abnormal   Collection Time: 03/29/24  9:04 AM   Specimen: Anterior Nasal Swab  Result Value Ref Range Status   SARS Coronavirus 2 by RT PCR NEGATIVE NEGATIVE Final    Comment: (NOTE) SARS-CoV-2 target nucleic acids are NOT DETECTED.  The SARS-CoV-2 RNA is generally detectable in upper respiratory specimens during the acute phase of infection. The lowest concentration of SARS-CoV-2 viral copies this assay can detect is 138 copies/mL. A negative result does not preclude SARS-Cov-2 infection and should not be used as the sole basis for treatment or other patient management decisions. A negative result may occur with  improper specimen collection/handling, submission of specimen other than nasopharyngeal swab, presence of viral mutation(s) within the areas targeted by this assay, and inadequate number of viral copies(<138 copies/mL). A negative result must be combined with clinical observations, patient history, and epidemiological information. The expected result is Negative.  Fact Sheet for Patients:  bloggercourse.com  Fact Sheet for Healthcare Providers:  seriousbroker.it  This test is no t yet approved or cleared by the [place]  FDA and  has been authorized for detection and/or diagnosis of SARS-CoV-2 by FDA under an Emergency Use Authorization (EUA). This EUA will remain  in effect (meaning this test can be used) for the duration of the COVID-19 declaration under Section 564(b)(1) of the Act, 21 U.S.C.section 360bbb-3(b)(1), unless the authorization is terminated  or revoked sooner.       Influenza A by PCR POSITIVE (A) NEGATIVE Final   Influenza B by PCR NEGATIVE NEGATIVE Final    Comment: (NOTE) The Xpert Xpress SARS-CoV-2/FLU/RSV plus assay is intended as an aid in the diagnosis of influenza from Nasopharyngeal swab specimens and should not be used as a sole basis for treatment. Nasal  washings and aspirates are unacceptable for Xpert Xpress SARS-CoV-2/FLU/RSV testing.  Fact Sheet for Patients: bloggercourse.com  Fact Sheet for Healthcare Providers: seriousbroker.it  This test is not yet approved or cleared by the [place]  FDA and has been authorized for detection and/or diagnosis of SARS-CoV-2 by FDA under an Emergency Use Authorization (EUA). This EUA will remain in effect (meaning this test can be used) for the duration of the COVID-19 declaration under Section 564(b)(1) of the Act, 21 U.S.C. section 360bbb-3(b)(1), unless the authorization is terminated or revoked.     Resp Syncytial Virus by PCR NEGATIVE NEGATIVE Final    Comment: (NOTE) Fact Sheet for Patients: bloggercourse.com  Fact Sheet for Healthcare Providers: seriousbroker.it  This test is not yet approved or cleared by the [place]  FDA and has been authorized for detection and/or diagnosis of SARS-CoV-2 by FDA under an Emergency Use Authorization (EUA). This EUA will remain in effect (meaning this test can be used) for the duration of the COVID-19 declaration under Section 564(b)(1) of the Act, 21 U.S.C. section 360bbb-3(b)(1), unless the authorization is terminated or revoked.  Performed at [facility], [address] [place], [place] [address]   Blood culture (routine x 2)     Status: None (Preliminary result)   Collection Time: 03/29/24 11:42 AM   Specimen: BLOOD  Result Value Ref Range Status   Specimen Description BLOOD BLOOD RIGHT HAND  Final   Special Requests   Final    BOTTLES DRAWN AEROBIC AND ANAEROBIC Blood Culture adequate volume   Culture   Final    NO GROWTH 2 DAYS Performed at [facility], [address]  [place], [place] [address]    Report Status PENDING  Incomplete  Blood culture (routine x 2)     Status: None (Preliminary result)    Collection Time: 03/29/24 11:42 AM   Specimen: BLOOD  Result Value Ref Range Status   Specimen Description BLOOD BLOOD LEFT HAND  Final   Special Requests   Final    BOTTLES DRAWN AEROBIC AND ANAEROBIC Blood Culture adequate volume   Culture   Final    NO GROWTH 2 DAYS Performed at [facility], [address], [place], [place] [address]    Report Status PENDING  Incomplete  MRSA Next Gen by PCR, Nasal     Status: None   Collection Time: 03/30/24 10:26 AM   Specimen: Nasal Mucosa; Nasal Swab  Result Value Ref Range Status   MRSA by PCR Next Gen NOT DETECTED NOT DETECTED Final    Comment: (NOTE) The GeneXpert MRSA Assay (FDA approved for NASAL specimens only), is one component of a comprehensive MRSA colonization surveillance program. It is not intended to diagnose MRSA infection nor to guide or monitor treatment for MRSA infections. Test performance is not FDA approved in patients less than 58 years old. Performed at [facility], [address] [place], [place] [address]     Labs: CBC: Recent Labs  Lab 03/29/24 0227 03/29/24 1142 03/30/24 0440 03/31/24 0418  WBC 13.8* 10.2 7.2 4.2  NEUTROABS 12.1*  --   --   --   HGB 11.1* 11.0* 10.1* 9.6*  HCT 33.7* 33.2* 30.8* 28.7*  MCV 89.6 89.0 89.3 88.9  PLT 254 225 204 201   Basic Metabolic Panel: Recent Labs  Lab 03/29/24 0227 03/29/24 1142 03/30/24 0440 03/31/24 0418  NA 137  --  142 143  K 4.1  --  3.8 3.5  CL 99  --  108 109  CO2 24  --  24 25  GLUCOSE 137*  --  106* 94  BUN 32*  --  32* 20  CREATININE 1.58* 1.55* 1.14 0.87  CALCIUM  8.8*  --  8.0* 8.2*   Liver Function Tests: Recent Labs  Lab 03/29/24 0227 03/30/24 0440 03/31/24 0418  AST 90* 219* 228*  ALT 27 50* 63*  ALKPHOS 76 65 58  BILITOT 0.8 0.6 0.5  PROT 6.6 5.8* 5.7*  ALBUMIN 3.9 3.4* 3.3*   CBG: Recent Labs  Lab 03/30/24 0750 03/30/24 1207 03/30/24 1628 03/30/24 2037 03/31/24 0814  GLUCAP 103* 107* 108* 131* 96     Discharge time spent: greater than 30 minutes.  This record has been created using Conservation officer, historic buildings. Errors have been sought and corrected,but may not always be located. Such creation errors do not reflect on the standard of care.   Signed: Amaryllis Dare, MD [facility] 03/31/2024 "
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
## 2024-03-31 NOTE — Plan of Care (Signed)
" °  Problem: Education: Goal: Knowledge of General Education information will improve Description: Including pain rating scale, medication(s)/side effects and non-pharmacologic comfort measures Outcome: Progressing   Problem: Clinical Measurements: Goal: Respiratory complications will improve Outcome: Progressing   Problem: Clinical Measurements: Goal: Cardiovascular complication will be avoided Outcome: Progressing   Problem: Activity: Goal: Risk for activity intolerance will decrease Outcome: Progressing   Problem: Nutrition: Goal: Adequate nutrition will be maintained Outcome: Progressing   Problem: Elimination: Goal: Will not experience complications related to urinary retention Outcome: Progressing   Problem: Pain Managment: Goal: General experience of comfort will improve and/or be controlled Outcome: Progressing   Problem: Safety: Goal: Ability to remain free from injury will improve Outcome: Progressing   "

## 2024-03-31 NOTE — Progress Notes (Signed)
 PHARMACY NOTE:  ANTIMICROBIAL RENAL DOSAGE ADJUSTMENT  Current antimicrobial regimen includes a mismatch between antimicrobial dosage and estimated renal function.  As per policy approved by the Pharmacy & Therapeutics and Medical Executive Committees, the antimicrobial dosage will be adjusted accordingly.  Current antimicrobial dosage:  oseltamivir  30mg  BID  Indication: Influenza   Renal Function:  Estimated Creatinine Clearance: 59.5 mL/min (by C-G formula based on SCr of 0.87 mg/dL).    Antimicrobial dosage has been changed to:  oseltamivir  75mg  BID   Thank you for allowing pharmacy to be a part of this patient's care.  Adriana JONETTA Bolster, PharmD, BCPS Clinical Pharmacist 03/31/2024 8:24 AM
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
## 2024-04-01 LAB — LEGIONELLA PNEUMOPHILA SEROGP 1 UR AG: L. pneumophila Serogp 1 Ur Ag: NEGATIVE

## 2024-04-03 LAB — CULTURE, BLOOD (ROUTINE X 2)
Culture: NO GROWTH
Culture: NO GROWTH
Special Requests: ADEQUATE
Special Requests: ADEQUATE
